# Patient Record
Sex: Male | Born: 1955 | Race: Black or African American | Hispanic: No | Marital: Single | Smoking: Current every day smoker
Health system: Southern US, Community
[De-identification: ages and names within clinical notes are randomized; demographics above are authoritative.]

## PROBLEM LIST (undated history)

## (undated) DIAGNOSIS — I639 Cerebral infarction, unspecified: Secondary | ICD-10-CM

## (undated) DIAGNOSIS — I10 Essential (primary) hypertension: Secondary | ICD-10-CM

## (undated) HISTORY — PX: NO PAST SURGERIES: SHX2092

---

## 1998-09-07 ENCOUNTER — Emergency Department (HOSPITAL_COMMUNITY): Admission: EM | Admit: 1998-09-07 | Discharge: 1998-09-07 | Payer: Self-pay | Admitting: Emergency Medicine

## 2005-10-12 ENCOUNTER — Emergency Department (HOSPITAL_COMMUNITY): Admission: EM | Admit: 2005-10-12 | Discharge: 2005-10-12 | Payer: Self-pay | Admitting: Emergency Medicine

## 2005-10-28 ENCOUNTER — Ambulatory Visit: Payer: Self-pay | Admitting: *Deleted

## 2005-10-28 ENCOUNTER — Ambulatory Visit: Payer: Self-pay | Admitting: Family Medicine

## 2005-11-18 ENCOUNTER — Ambulatory Visit: Payer: Self-pay | Admitting: Family Medicine

## 2005-12-16 ENCOUNTER — Ambulatory Visit: Payer: Self-pay | Admitting: Family Medicine

## 2006-02-17 ENCOUNTER — Ambulatory Visit: Payer: Self-pay | Admitting: Family Medicine

## 2006-07-03 ENCOUNTER — Ambulatory Visit: Payer: Self-pay | Admitting: Family Medicine

## 2006-08-12 ENCOUNTER — Ambulatory Visit: Payer: Self-pay | Admitting: Family Medicine

## 2006-09-04 ENCOUNTER — Ambulatory Visit: Payer: Self-pay | Admitting: Family Medicine

## 2006-09-15 ENCOUNTER — Ambulatory Visit: Payer: Self-pay | Admitting: Family Medicine

## 2006-10-13 ENCOUNTER — Ambulatory Visit: Payer: Self-pay | Admitting: Family Medicine

## 2006-11-17 ENCOUNTER — Ambulatory Visit: Payer: Self-pay | Admitting: Family Medicine

## 2006-11-18 ENCOUNTER — Encounter (INDEPENDENT_AMBULATORY_CARE_PROVIDER_SITE_OTHER): Payer: Self-pay | Admitting: Family Medicine

## 2006-12-30 ENCOUNTER — Encounter (INDEPENDENT_AMBULATORY_CARE_PROVIDER_SITE_OTHER): Payer: Self-pay | Admitting: *Deleted

## 2008-11-26 ENCOUNTER — Emergency Department (HOSPITAL_COMMUNITY): Admission: EM | Admit: 2008-11-26 | Discharge: 2008-11-26 | Payer: Self-pay | Admitting: Emergency Medicine

## 2010-06-12 ENCOUNTER — Emergency Department (HOSPITAL_COMMUNITY)
Admission: EM | Admit: 2010-06-12 | Discharge: 2010-06-12 | Disposition: A | Discharge status: Home / Self Care | Payer: Self-pay | Attending: Emergency Medicine | Admitting: Emergency Medicine

## 2010-06-12 DIAGNOSIS — F17200 Nicotine dependence, unspecified, uncomplicated: Secondary | ICD-10-CM | POA: Insufficient documentation

## 2010-06-12 DIAGNOSIS — K029 Dental caries, unspecified: Secondary | ICD-10-CM | POA: Insufficient documentation

## 2010-06-12 DIAGNOSIS — K089 Disorder of teeth and supporting structures, unspecified: Secondary | ICD-10-CM | POA: Insufficient documentation

## 2010-07-11 ENCOUNTER — Emergency Department (HOSPITAL_COMMUNITY)
Admission: EM | Admit: 2010-07-11 | Discharge: 2010-07-11 | Disposition: A | Discharge status: Home / Self Care | Payer: Self-pay | Attending: Emergency Medicine | Admitting: Emergency Medicine

## 2010-07-11 DIAGNOSIS — F17200 Nicotine dependence, unspecified, uncomplicated: Secondary | ICD-10-CM | POA: Insufficient documentation

## 2010-07-11 DIAGNOSIS — R599 Enlarged lymph nodes, unspecified: Secondary | ICD-10-CM | POA: Insufficient documentation

## 2010-07-11 DIAGNOSIS — K089 Disorder of teeth and supporting structures, unspecified: Secondary | ICD-10-CM | POA: Insufficient documentation

## 2012-06-02 ENCOUNTER — Encounter (HOSPITAL_COMMUNITY): Payer: Self-pay | Admitting: Emergency Medicine

## 2012-06-02 ENCOUNTER — Emergency Department (HOSPITAL_COMMUNITY)
Admission: EM | Admit: 2012-06-02 | Discharge: 2012-06-04 | Disposition: A | Discharge status: Home / Self Care | Payer: Medicaid Other | Attending: Emergency Medicine | Admitting: Emergency Medicine

## 2012-06-02 DIAGNOSIS — F17200 Nicotine dependence, unspecified, uncomplicated: Secondary | ICD-10-CM | POA: Insufficient documentation

## 2012-06-02 DIAGNOSIS — F1020 Alcohol dependence, uncomplicated: Secondary | ICD-10-CM | POA: Insufficient documentation

## 2012-06-02 LAB — CBC WITH DIFFERENTIAL/PLATELET
Basophils Absolute: 0 10*3/uL (ref 0.0–0.1)
Basophils Relative: 0 % (ref 0–1)
Eosinophils Relative: 1 % (ref 0–5)
HCT: 33.5 % — ABNORMAL LOW (ref 39.0–52.0)
MCHC: 36.1 g/dL — ABNORMAL HIGH (ref 30.0–36.0)
MCV: 93.6 fL (ref 78.0–100.0)
Monocytes Absolute: 0.5 10*3/uL (ref 0.1–1.0)
Neutro Abs: 4.5 10*3/uL (ref 1.7–7.7)
RDW: 12.9 % (ref 11.5–15.5)

## 2012-06-02 LAB — RAPID URINE DRUG SCREEN, HOSP PERFORMED
Barbiturates: NOT DETECTED
Benzodiazepines: NOT DETECTED
Cocaine: NOT DETECTED
Tetrahydrocannabinol: NOT DETECTED

## 2012-06-02 LAB — COMPREHENSIVE METABOLIC PANEL
AST: 63 U/L — ABNORMAL HIGH (ref 0–37)
Albumin: 3.7 g/dL (ref 3.5–5.2)
Calcium: 8.7 mg/dL (ref 8.4–10.5)
Creatinine, Ser: 0.78 mg/dL (ref 0.50–1.35)

## 2012-06-02 MED ORDER — FOLIC ACID 1 MG PO TABS
1.0000 mg | ORAL_TABLET | Freq: Every day | ORAL | Status: DC
  Administered 2012-06-03 – 2012-06-04 (×2): 1 mg via ORAL
  Filled 2012-06-02 (×2): qty 1

## 2012-06-02 MED ORDER — LORAZEPAM 1 MG PO TABS
1.0000 mg | ORAL_TABLET | Freq: Three times a day (TID) | ORAL | Status: DC | PRN
  Administered 2012-06-03: 1 mg via ORAL
  Filled 2012-06-02: qty 1

## 2012-06-02 MED ORDER — LORAZEPAM 2 MG/ML IJ SOLN: 1.0000 mg | Freq: Four times a day (QID) | INTRAMUSCULAR | Status: DC | PRN

## 2012-06-02 MED ORDER — ADULT MULTIVITAMIN W/MINERALS CH
1.0000 | ORAL_TABLET | Freq: Every day | ORAL | Status: DC
  Administered 2012-06-03 – 2012-06-04 (×2): 1 via ORAL
  Filled 2012-06-02 (×2): qty 1

## 2012-06-02 MED ORDER — LORAZEPAM 1 MG PO TABS: 1.0000 mg | ORAL_TABLET | Freq: Four times a day (QID) | ORAL | Status: DC | PRN

## 2012-06-02 MED ORDER — NICOTINE 21 MG/24HR TD PT24
21.0000 mg | MEDICATED_PATCH | Freq: Every day | TRANSDERMAL | Status: DC
  Administered 2012-06-03 – 2012-06-04 (×2): 21 mg via TRANSDERMAL
  Filled 2012-06-02 (×2): qty 1

## 2012-06-02 MED ORDER — ALUM & MAG HYDROXIDE-SIMETH 200-200-20 MG/5ML PO SUSP: 30.0000 mL | ORAL | Status: DC | PRN

## 2012-06-02 MED ORDER — ONDANSETRON HCL 4 MG PO TABS: 4.0000 mg | ORAL_TABLET | Freq: Three times a day (TID) | ORAL | Status: DC | PRN

## 2012-06-02 MED ORDER — ZOLPIDEM TARTRATE 5 MG PO TABS: 5.0000 mg | ORAL_TABLET | Freq: Every evening | ORAL | Status: DC | PRN

## 2012-06-02 MED ORDER — IBUPROFEN 200 MG PO TABS: 600.0000 mg | ORAL_TABLET | Freq: Three times a day (TID) | ORAL | Status: DC | PRN

## 2012-06-02 MED ORDER — THIAMINE HCL 100 MG/ML IJ SOLN: 100.0000 mg | Freq: Every day | INTRAMUSCULAR | Status: DC

## 2012-06-02 MED ORDER — VITAMIN B-1 100 MG PO TABS
100.0000 mg | ORAL_TABLET | Freq: Every day | ORAL | Status: DC
  Administered 2012-06-03 – 2012-06-04 (×2): 100 mg via ORAL
  Filled 2012-06-02 (×2): qty 1

## 2012-06-02 NOTE — ED Notes (Signed)
Pt states he is here for detox from alcohol  Last drank today  Pt usually drinks a 12pk of beer per day  Pt has been drinking for 30+ years

## 2012-06-02 NOTE — ED Provider Notes (Signed)
History    This chart was scribed for Johnnette Gourd, PA-C,non-physician practitioner working with Laray Anger, DO by Charolett Bumpers, ED Scribe. This patient was seen in room [facility]3/[facility]3 and the patient's care was started at 2147.    CSN: [id number]  Arrival date & time 06/02/12  2038   First MD Initiated Contact with Patient 06/02/12 2147      Chief Complaint  Patient presents with  . detox    Level V Caveat: Intoxicated  The history is provided by the patient. The history is limited by the condition of the patient. No language interpreter was used.   Jose Neal is a 57 y.o. male who presents to the Emergency Department for alcohol detox. He states that he drank 3-4 40 oz beers today. He has been drinking for the past 30 years and drinks a 12 pk of beer daily. He denies any other drug use. He states that he has never been in a detox program in the past. He states that he wants help with detox and was brought here by his daughters. He states that he needs help detoxing and cannot do this outside of a hospital since he "will just continue".  History reviewed. No pertinent past medical history.  History reviewed. No pertinent past surgical history.  Family History  Problem Relation Age of Onset  . Cancer Mother   . Diabetes Father   . Glaucoma Father   . Cancer Sister   . Glaucoma Other     History  Substance Use Topics  . Smoking status: Current Every Day Smoker    Types: Cigarettes  . Smokeless tobacco: Not on file  . Alcohol Use: 7.2 oz/week    12 Cans of beer per week     Comment: heavy      Review of Systems  Unable to perform ROS: Other  Intoxicated  Allergies  Review of patient's allergies indicates no known allergies.  Home Medications  No current outpatient prescriptions on file.  BP 149/84  Pulse 71  Temp(Src) 97.8 F (36.6 C) (Oral)  Resp 20  Wt 182 lb 6 oz (82.725 kg)  SpO2 100%  Physical Exam  Nursing note and vitals  reviewed. Constitutional: He is oriented to person, place, and time. He appears well-developed and well-nourished. No distress.  Breath smells of ETOH.   HENT:  Head: Normocephalic and atraumatic.  Right Ear: External ear normal.  Left Ear: External ear normal.  Nose: Nose normal.  Mouth/Throat: Oropharynx is clear and moist. No oropharyngeal exudate.  Eyes: Conjunctivae and EOM are normal. Pupils are equal, round, and reactive to light.  Neck: Normal range of motion. Neck supple. No tracheal deviation present.  Cardiovascular: Normal rate, regular rhythm and normal heart sounds.   Pulmonary/Chest: Effort normal and breath sounds normal. No respiratory distress.  Abdominal: Soft. Bowel sounds are normal. He exhibits no distension.  Musculoskeletal: Normal range of motion. He exhibits no edema.  Neurological: He is alert and oriented to person, place, and time.  Skin: Skin is warm and dry.  Psychiatric: His speech is tangential and slurred. He is hyperactive. He expresses no homicidal and no suicidal ideation.    ED Course  Procedures (including critical care time)  DIAGNOSTIC STUDIES: Oxygen Saturation is 100% on room air, normal by my interpretation.    COORDINATION OF CARE:  22:00-Discussed planned course of treatment with the patient including blood work and urine screen who is agreeable at this time.  Labs Reviewed  CBC WITH DIFFERENTIAL  COMPREHENSIVE METABOLIC PANEL  URINE RAPID DRUG SCREEN (HOSP PERFORMED)  ETHANOL   No results found.   No diagnosis found.    MDM  57 y/o male requesting detox from alcohol. He has not been sober for over 30 years. Patient placed on CIWA protocol. Awaiting ACT team consult for placement.    I personally performed the services described in this documentation, which was scribed in my presence. The recorded information has been reviewed and is accurate.      Trevor Mace, PA-C 06/03/12 0010

## 2012-06-03 DIAGNOSIS — F1020 Alcohol dependence, uncomplicated: Secondary | ICD-10-CM

## 2012-06-03 LAB — COMPREHENSIVE METABOLIC PANEL
AST: 71 U/L — ABNORMAL HIGH (ref 0–37)
Albumin: 3.5 g/dL (ref 3.5–5.2)
Calcium: 9.1 mg/dL (ref 8.4–10.5)
Creatinine, Ser: 0.74 mg/dL (ref 0.50–1.35)
Total Protein: 7 g/dL (ref 6.0–8.3)

## 2012-06-03 LAB — CBC WITH DIFFERENTIAL/PLATELET
Basophils Absolute: 0 10*3/uL (ref 0.0–0.1)
Basophils Relative: 0 % (ref 0–1)
Eosinophils Relative: 1 % (ref 0–5)
HCT: 32.9 % — ABNORMAL LOW (ref 39.0–52.0)
MCHC: 35.9 g/dL (ref 30.0–36.0)
MCV: 93.7 fL (ref 78.0–100.0)
Monocytes Absolute: 0.5 10*3/uL (ref 0.1–1.0)
RDW: 12.9 % (ref 11.5–15.5)

## 2012-06-03 LAB — ETHANOL: Alcohol, Ethyl (B): 156 mg/dL — ABNORMAL HIGH (ref 0–11)

## 2012-06-03 MED ORDER — SODIUM CHLORIDE 0.9 % IV BOLUS (SEPSIS)
1000.0000 mL | Freq: Once | INTRAVENOUS | Status: AC
  Administered 2012-06-03: 1000 mL via INTRAVENOUS

## 2012-06-03 NOTE — BH Assessment (Signed)
Assessment Note   Jose Neal is a 57 y.o. male presenting to [facility] for detox from alcohol.  Pt denies SI/HI/Psych.  Pt consumes 12 pk of beer and 1-2 shots daily, last intake was 06/02/12.  Pt has no prior detox hx nd denies any current w/d sxs.  Pt is homeless and says his children encouraged him to seek treatment because they are concerned about his chronic substance abuse.  Pt denies any legal issues. Pt has no issues with seizures or blackouts.    Axis I: Alcohol Dependence  Axis II: Deferred Axis III: History reviewed. No pertinent past medical history. Axis IV: housing problems, other psychosocial or environmental problems, problems related to social environment and problems with primary support group Axis V: 51-60 moderate symptoms  Past Medical History: History reviewed. No pertinent past medical history.  History reviewed. No pertinent past surgical history.  Family History:  Family History  Problem Relation Age of Onset  . Cancer Mother   . Diabetes Father   . Glaucoma Father   . Cancer Sister   . Glaucoma Other     Social History:  reports that he has been smoking Cigarettes.  He has been smoking about 0.00 packs per day. He does not have any smokeless tobacco history on file. He reports that he drinks about 7.2 ounces of alcohol per week. He reports that he does not use illicit drugs.  Additional Social History:  Alcohol / Drug Use Pain Medications: None  Prescriptions: None  Over the Counter: None  History of alcohol / drug use?: Yes Longest period of sobriety (when/how long): None  Negative Consequences of Use: Personal relationships Withdrawal Symptoms: Other (Comment) (No current w/d sxs) Substance #1 Name of Substance 1: Alcohol  1 - Age of First Use: 20 YOM  1 - Amount (size/oz): 12 Pk; 1-2 Shots  1 - Frequency: Daily  1 - Duration: On-going  1 - Last Use / Amount: 06/02/12  CIWA: CIWA-Ar BP: 149/84 mmHg Pulse Rate: 71 Nausea and Vomiting: no  nausea and no vomiting Tactile Disturbances: none Tremor: no tremor Auditory Disturbances: not present Paroxysmal Sweats: no sweat visible Visual Disturbances: not present Anxiety: no anxiety, at ease Headache, Fullness in Head: none present Agitation: normal activity Orientation and Clouding of Sensorium: oriented and can do serial additions CIWA-Ar Total: 0 COWS:    Allergies: No Known Allergies  Home Medications:  (Not in a hospital admission)  OB/GYN Status:  No LMP for male patient.  General Assessment Data Location of Assessment: [facility] Living Arrangements: Other (Comment) (Homeless ) Can pt return to current living arrangement?: No Admission Status: Voluntary Is patient capable of signing voluntary admission?: Yes Transfer from: Acute Hospital Referral Source: MD  Education Status Is patient currently in school?: No Current Grade: None  Highest grade of school patient has completed: None  Name of school: None  Contact person: None   Risk to self Suicidal Ideation: No Suicidal Intent: No Is patient at risk for suicide?: No Suicidal Plan?: No Access to Means: No What has been your use of drugs/alcohol within the last 12 months?: Abusing: alcohol  Previous Attempts/Gestures: No How many times?: 0 Other Self Harm Risks: None  Triggers for Past Attempts: None known Intentional Self Injurious Behavior: None Family Suicide History: No Recent stressful life event(s): Other (Comment) (Homeless; Chronic SA) Persecutory voices/beliefs?: No Depression: No Depression Symptoms:  (None Reported ) Substance abuse history and/or treatment for substance abuse?: Yes Suicide prevention information given to non-admitted  patients: Not applicable  Risk to Others Homicidal Ideation: No Thoughts of Harm to Others: No Current Homicidal Intent: No Current Homicidal Plan: No Access to Homicidal Means: No Identified Victim: None  History of harm to others?: No Assessment of  Violence: None Noted Violent Behavior Description: None  Does patient have access to weapons?: No Criminal Charges Pending?: No Does patient have a court date: No  Psychosis Hallucinations: None noted Delusions: None noted  Mental Status Report Appear/Hygiene: Disheveled Eye Contact: Fair Motor Activity: Unremarkable Speech: Logical/coherent Level of Consciousness: Alert Mood: Other (Comment) (Appropriate, pleasant ) Affect: Appropriate to circumstance Anxiety Level: None Thought Processes: Coherent;Relevant Judgement: Unimpaired Orientation: Person;Place;Time;Situation Obsessive Compulsive Thoughts/Behaviors: None  Cognitive Functioning Concentration: Normal Memory: Recent Intact;Remote Intact IQ: Average Insight: Fair Impulse Control: Fair Appetite: Good Weight Loss: 0 Weight Gain: 0 Sleep: No Change Total Hours of Sleep: 8 Vegetative Symptoms: None  ADLScreening [facility]) Patient's cognitive ability adequate to safely complete daily activities?: Yes Patient able to express need for assistance with ADLs?: Yes Independently performs ADLs?: Yes (appropriate for developmental age)  Abuse/Neglect [facility]) Physical Abuse: Denies Verbal Abuse: Denies Sexual Abuse: Denies  Prior Inpatient Therapy Prior Inpatient Therapy: No Prior Therapy Dates: None  Prior Therapy Facilty/Provider(s): None  Reason for Treatment: None   Prior Outpatient Therapy Prior Outpatient Therapy: No Prior Therapy Dates: None  Prior Therapy Facilty/Provider(s): None  Reason for Treatment: None   ADL Screening (condition at time of admission) Patient's cognitive ability adequate to safely complete daily activities?: Yes Patient able to express need for assistance with ADLs?: Yes Independently performs ADLs?: Yes (appropriate for developmental age) Weakness of Legs: None Weakness of Arms/Hands: None  Home Assistive Devices/Equipment Home Assistive Devices/Equipment:  None  Therapy Consults (therapy consults require a physician order) PT Evaluation Needed: No OT Evalulation Needed: No SLP Evaluation Needed: No Abuse/Neglect Assessment (Assessment to be complete while patient is alone) Physical Abuse: Denies Verbal Abuse: Denies Sexual Abuse: Denies Exploitation of patient/patient's resources: Denies Self-Neglect: Denies Values / Beliefs Cultural Requests During Hospitalization: None Spiritual Requests During Hospitalization: None   Advance Directives (For Healthcare) Advance Directive: Patient does not have advance directive;Patient would not like information Pre-existing out of facility DNR order (yellow form or pink MOST form): No Nutrition Screen- [facility] Patient's home diet: Regular Have you recently lost weight without trying?: No Have you been eating poorly because of a decreased appetite?: No Malnutrition Screening Tool Score: 0  Additional Information 1:1 In Past 12 Months?: No CIRT Risk: No Elopement Risk: No Does patient have medical clearance?: Yes     Disposition:  Disposition Disposition of Patient: Inpatient treatment program;Referred to [facility] ) Type of inpatient treatment program: Adult Patient referred to: Other (Comment) [facility] )  On Site Evaluation by:   Reviewed with Physician:     Murrell Redden 06/03/2012 12:26 AM

## 2012-06-03 NOTE — ED Provider Notes (Addendum)
Patient sleeping on AM rounds.  Filed Vitals:   06/03/12 0547  BP: 145/71  Pulse: 74  Temp:   Resp:     Placement pending    Gerhard Munch, MD 06/03/12 [phone]  I was made aware that the patient's Na was 123.  The patient had been in Psych ED, but will be transferred to [facility] for IVF, repeat labs.  3:18 PM  Repeat sodium is closer to normal.  The patient will continue to receive rehydration, and he remains in no distress with unremarkable vital signs, pending behavioral health placement.  Gerhard Munch, MD 06/03/12 [phone]

## 2012-06-03 NOTE — ED Notes (Signed)
NS bolus complete.

## 2012-06-03 NOTE — BHH Counselor (Signed)
Patient reviewed for admission to [facility] by Donell Sievert, PA and declined due to hyponatremia, with a sodium of 123, and stated that the patient needs IV fluids.

## 2012-06-03 NOTE — ED Notes (Signed)
Lab at bedside to collect blood.  

## 2012-06-03 NOTE — Consult Note (Signed)
Reason for Consult: Alcohol intoxication and seeking for the detox treatment Referring Physician: Dr. Annita Brod is an 57 y.o. male.  HPI: Patient was seen and chart reviewed. Patient came to the [facility] long emergency department [place] requesting alcohol detox treatment. Patient has been drinking several years and has no previous alcohol detox treatment at rehabilitation services. Patient has an electrolyte imbalance receiving the supplementation to IV line. Patient has a mild tremor on the upper extremity. Patient has no history of alcohol seizures blackouts or motor vehicle accidents. Patient does note symptoms of depression psychosis. And  MSE: Patient was awake alert oriented place person and situation. Patient has anxious mood with the appropriate affect he has normal rate rhythm and volume of speech his thought process is linear and goal-directed he has denied suicidal onset ideation and has no evidence of psychotic symptoms.  History reviewed. No pertinent past medical history.  History reviewed. No pertinent past surgical history.  Family History  Problem Relation Age of Onset  . Cancer Mother   . Diabetes Father   . Glaucoma Father   . Cancer Sister   . Glaucoma Other     Social History:  reports that he has been smoking Cigarettes.  He has been smoking about 0.00 packs per day. He does not have any smokeless tobacco history on file. He reports that he drinks about 7.2 ounces of alcohol per week. He reports that he does not use illicit drugs.  Allergies: No Known Allergies  Medications: I have reviewed the patient's current medications.  Results for orders placed during the hospital encounter of 06/02/12 (from the past 48 hour(s))  URINE RAPID DRUG SCREEN (HOSP PERFORMED)     Status: None   Collection Time    06/02/12 10:14 PM      Result Value Range   Opiates NONE DETECTED  NONE DETECTED   Cocaine NONE DETECTED  NONE DETECTED   Benzodiazepines NONE  DETECTED  NONE DETECTED   Amphetamines NONE DETECTED  NONE DETECTED   Tetrahydrocannabinol NONE DETECTED  NONE DETECTED   Barbiturates NONE DETECTED  NONE DETECTED   Comment:            DRUG SCREEN FOR MEDICAL PURPOSES     ONLY.  IF CONFIRMATION IS NEEDED     FOR ANY PURPOSE, NOTIFY LAB     WITHIN 5 DAYS.                LOWEST DETECTABLE LIMITS     FOR URINE DRUG SCREEN     Drug Class       Cutoff (ng/mL)     Amphetamine      1000     Barbiturate      200     Benzodiazepine   200     Tricyclics       300     Opiates          300     Cocaine          300     THC              50  CBC WITH DIFFERENTIAL     Status: Abnormal   Collection Time    06/02/12 10:15 PM      Result Value Range   WBC 7.9  4.0 - 10.5 K/uL   RBC 3.58 (*) 4.22 - 5.81 MIL/uL   Hemoglobin 12.1 (*) 13.0 - 17.0 g/dL   HCT 29.5 (*) 62.1 -  52.0 %   MCV 93.6  78.0 - 100.0 fL   MCH 33.8  26.0 - 34.0 pg   MCHC 36.1 (*) 30.0 - 36.0 g/dL   RDW 27.2  53.6 - 64.4 %   Platelets 144 (*) 150 - 400 K/uL   Neutrophils Relative 58  43 - 77 %   Neutro Abs 4.5  1.7 - 7.7 K/uL   Lymphocytes Relative 35  12 - 46 %   Lymphs Abs 2.8  0.7 - 4.0 K/uL   Monocytes Relative 7  3 - 12 %   Monocytes Absolute 0.5  0.1 - 1.0 K/uL   Eosinophils Relative 1  0 - 5 %   Eosinophils Absolute 0.1  0.0 - 0.7 K/uL   Basophils Relative 0  0 - 1 %   Basophils Absolute 0.0  0.0 - 0.1 K/uL  COMPREHENSIVE METABOLIC PANEL     Status: Abnormal   Collection Time    06/02/12 10:15 PM      Result Value Range   Sodium 123 (*) 135 - 145 mEq/L   Potassium 3.7  3.5 - 5.1 mEq/L   Chloride 86 (*) 96 - 112 mEq/L   CO2 23  19 - 32 mEq/L   Glucose, Bld 95  70 - 99 mg/dL   BUN 5 (*) 6 - 23 mg/dL   Creatinine, Ser 0.34  0.50 - 1.35 mg/dL   Calcium 8.7  8.4 - 74.2 mg/dL   Total Protein 7.9  6.0 - 8.3 g/dL   Albumin 3.7  3.5 - 5.2 g/dL   AST 63 (*) 0 - 37 U/L   ALT 58 (*) 0 - 53 U/L   Alkaline Phosphatase 103  39 - 117 U/L   Total Bilirubin 0.3  0.3 -  1.2 mg/dL   GFR calc non Af Amer >90  >90 mL/min   GFR calc Af Amer >90  >90 mL/min   Comment:            The eGFR has been calculated     using the CKD EPI equation.     This calculation has not been     validated in all clinical     situations.     eGFR's persistently     <90 mL/min signify     possible Chronic Kidney Disease.  ETHANOL     Status: Abnormal   Collection Time    06/02/12 10:15 PM      Result Value Range   Alcohol, Ethyl (B) 247 (*) 0 - 11 mg/dL   Comment:            LOWEST DETECTABLE LIMIT FOR     SERUM ALCOHOL IS 11 mg/dL     FOR MEDICAL PURPOSES ONLY  ETHANOL     Status: Abnormal   Collection Time    06/03/12  2:20 AM      Result Value Range   Alcohol, Ethyl (B) 156 (*) 0 - 11 mg/dL   Comment:            LOWEST DETECTABLE LIMIT FOR     SERUM ALCOHOL IS 11 mg/dL     FOR MEDICAL PURPOSES ONLY  CBC WITH DIFFERENTIAL     Status: Abnormal   Collection Time    06/03/12 12:47 PM      Result Value Range   WBC 6.4  4.0 - 10.5 K/uL   RBC 3.51 (*) 4.22 - 5.81 MIL/uL   Hemoglobin 11.8 (*) 13.0 - 17.0 g/dL  HCT 32.9 (*) 39.0 - 52.0 %   MCV 93.7  78.0 - 100.0 fL   MCH 33.6  26.0 - 34.0 pg   MCHC 35.9  30.0 - 36.0 g/dL   RDW 45.4  09.8 - 11.9 %   Platelets 167  150 - 400 K/uL   Neutrophils Relative 63  43 - 77 %   Neutro Abs 4.0  1.7 - 7.7 K/uL   Lymphocytes Relative 27  12 - 46 %   Lymphs Abs 1.7  0.7 - 4.0 K/uL   Monocytes Relative 8  3 - 12 %   Monocytes Absolute 0.5  0.1 - 1.0 K/uL   Eosinophils Relative 1  0 - 5 %   Eosinophils Absolute 0.1  0.0 - 0.7 K/uL   Basophils Relative 0  0 - 1 %   Basophils Absolute 0.0  0.0 - 0.1 K/uL  COMPREHENSIVE METABOLIC PANEL     Status: Abnormal   Collection Time    06/03/12 12:47 PM      Result Value Range   Sodium 129 (*) 135 - 145 mEq/L   Potassium 4.6  3.5 - 5.1 mEq/L   Comment: DELTA CHECK NOTED     REPEATED TO VERIFY     NO VISIBLE HEMOLYSIS   Chloride 95 (*) 96 - 112 mEq/L   Comment: DELTA CHECK NOTED      REPEATED TO VERIFY   CO2 24  19 - 32 mEq/L   Glucose, Bld 92  70 - 99 mg/dL   BUN 9  6 - 23 mg/dL   Creatinine, Ser 1.47  0.50 - 1.35 mg/dL   Calcium 9.1  8.4 - 82.9 mg/dL   Total Protein 7.0  6.0 - 8.3 g/dL   Albumin 3.5  3.5 - 5.2 g/dL   AST 71 (*) 0 - 37 U/L   ALT 56 (*) 0 - 53 U/L   Alkaline Phosphatase 102  39 - 117 U/L   Total Bilirubin 0.3  0.3 - 1.2 mg/dL   GFR calc non Af Amer >90  >90 mL/min   GFR calc Af Amer >90  >90 mL/min   Comment:            The eGFR has been calculated     using the CKD EPI equation.     This calculation has not been     validated in all clinical     situations.     eGFR's persistently     <90 mL/min signify     possible Chronic Kidney Disease.  ETHANOL     Status: None   Collection Time    06/03/12 12:47 PM      Result Value Range   Alcohol, Ethyl (B) <11  0 - 11 mg/dL   Comment:            LOWEST DETECTABLE LIMIT FOR     SERUM ALCOHOL IS 11 mg/dL     FOR MEDICAL PURPOSES ONLY    No results found.  Positive for excessive alcohol consumption Blood pressure 156/88, pulse 70, temperature 99.5 F (37.5 C), temperature source Oral, resp. rate 20, weight 182 lb 6 oz (82.725 kg), SpO2 99.00%.   Assessment/Plan: Alcohol dependence  Alcohol intoxication and withdrawl  Recommendation: Recommended inpatient psychiatric hospitalization for alcohol detox treatment and then rehabilitation services. Patient needs to be medically stable for inpatient hospitalization at behavior health.   Norvil Martensen,JANARDHAHA R. 06/03/2012, 4:14 PM

## 2012-06-03 NOTE — ED Notes (Signed)
Pt offered Ativan for restlessness. Pt refused. Will continue to monitor.

## 2012-06-03 NOTE — Progress Notes (Signed)
[facility] ED CM consulted with ED RN, Danne Harbor about treatment for low NA Encouraged her to speak with EDP about further labs for 06/03/12 pm or 06/04/12 to re check NA

## 2012-06-03 NOTE — ED Notes (Signed)
[facility] aware of pts repeat sodium of 129. Reports that pt will not be considered medically cleared until sodium is closer to 135. Pt has had 2 liters of normal saline. Repeat cmet has been ordered for 0630 on 06/04/12. Saline lock remains intact. Pt aware. No disposition to be set until improvement in sodium level.

## 2012-06-03 NOTE — ED Provider Notes (Signed)
Medical screening examination/treatment/procedure(s) were performed by non-physician practitioner and as supervising physician I was immediately available for consultation/collaboration.   Alvah Gilder M Eyanna Mcgonagle, DO 06/03/12 1229 

## 2012-06-04 LAB — COMPREHENSIVE METABOLIC PANEL
ALT: 49 U/L (ref 0–53)
AST: 52 U/L — ABNORMAL HIGH (ref 0–37)
Albumin: 3.6 g/dL (ref 3.5–5.2)
Alkaline Phosphatase: 100 U/L (ref 39–117)
BUN: 9 mg/dL (ref 6–23)
CO2: 24 mEq/L (ref 19–32)
Calcium: 9.3 mg/dL (ref 8.4–10.5)
Chloride: 94 mEq/L — ABNORMAL LOW (ref 96–112)
Creatinine, Ser: 0.86 mg/dL (ref 0.50–1.35)
GFR calc Af Amer: >90 mL/min (ref >90)
GFR calc non Af Amer: >90 mL/min (ref >90)
Glucose, Bld: 111 mg/dL — ABNORMAL HIGH (ref 70–99)
Potassium: 4.1 mEq/L (ref 3.5–5.1)
Sodium: 128 mEq/L — ABNORMAL LOW (ref 135–145)
Total Bilirubin: 0.4 mg/dL (ref 0.3–1.2)
Total Protein: 7.3 g/dL (ref 6.0–8.3)

## 2012-06-04 NOTE — BHH Counselor (Signed)
Patient no longer meets criteria for inpatient psychiatric detox. Patient has been given residential treatment facilities list to follow up with outpatient services.

## 2012-06-04 NOTE — ED Provider Notes (Signed)
Filed Vitals:   06/04/12 0620  BP: 144/90  Pulse: 67  Temp:   Resp:    Pt with no acute events overnight. Apparently needs near normal Na level to be medically cleared although baseline unknown and asymptomatic. To be repeated this am. Pt still reports he wants detox although he doesn't seem to have clear plan after that. Pending further review at [facility].   Raeford Razor, MD 06/04/12 [phone]

## 2012-06-04 NOTE — Consult Note (Signed)
Reason for Consult: Alcohol dependence Referring Physician: Dr. Aldean Jewett is an 57 y.o. male.  HPI: Patient has been staying in his bed, quite cooperative to. Patient has no reported alcohol with the drive symptoms. Staff nurse reported the patient has no specific symptoms of withdrawal as per CIWA and does not requite when necessary medication. Patient denies symptoms of depression anxiety and psychosis  MSE: Patient was calm and quite cooperative. Patient has no abnormal psychomotor activity. Patient has fine mood with the appropriate affect. Patient has normal speech and thought process. Patient has no evidence of psychotic symptoms.  History reviewed. No pertinent past medical history.  History reviewed. No pertinent past surgical history.  Family History  Problem Relation Age of Onset  . Cancer Mother   . Diabetes Father   . Glaucoma Father   . Cancer Sister   . Glaucoma Other     Social History:  reports that he has been smoking Cigarettes.  He has been smoking about 0.00 packs per day. He does not have any smokeless tobacco history on file. He reports that he drinks about 7.2 ounces of alcohol per week. He reports that he does not use illicit drugs.  Allergies: No Known Allergies  Medications: I have reviewed the patient's current medications.  Results for orders placed during the hospital encounter of 06/02/12 (from the past 48 hour(s))  URINE RAPID DRUG SCREEN (HOSP PERFORMED)     Status: None   Collection Time    06/02/12 10:14 PM      Result Value Range   Opiates NONE DETECTED  NONE DETECTED   Cocaine NONE DETECTED  NONE DETECTED   Benzodiazepines NONE DETECTED  NONE DETECTED   Amphetamines NONE DETECTED  NONE DETECTED   Tetrahydrocannabinol NONE DETECTED  NONE DETECTED   Barbiturates NONE DETECTED  NONE DETECTED   Comment:            DRUG SCREEN FOR MEDICAL PURPOSES     ONLY.  IF CONFIRMATION IS NEEDED     FOR ANY PURPOSE, NOTIFY LAB     WITHIN 5  DAYS.                LOWEST DETECTABLE LIMITS     FOR URINE DRUG SCREEN     Drug Class       Cutoff (ng/mL)     Amphetamine      1000     Barbiturate      200     Benzodiazepine   200     Tricyclics       300     Opiates          300     Cocaine          300     THC              50  CBC WITH DIFFERENTIAL     Status: Abnormal   Collection Time    06/02/12 10:15 PM      Result Value Range   WBC 7.9  4.0 - 10.5 K/uL   RBC 3.58 (*) 4.22 - 5.81 MIL/uL   Hemoglobin 12.1 (*) 13.0 - 17.0 g/dL   HCT 16.1 (*) 09.6 - 04.5 %   MCV 93.6  78.0 - 100.0 fL   MCH 33.8  26.0 - 34.0 pg   MCHC 36.1 (*) 30.0 - 36.0 g/dL   RDW 40.9  81.1 - 91.4 %   Platelets 144 (*) 150 -  400 K/uL   Neutrophils Relative 58  43 - 77 %   Neutro Abs 4.5  1.7 - 7.7 K/uL   Lymphocytes Relative 35  12 - 46 %   Lymphs Abs 2.8  0.7 - 4.0 K/uL   Monocytes Relative 7  3 - 12 %   Monocytes Absolute 0.5  0.1 - 1.0 K/uL   Eosinophils Relative 1  0 - 5 %   Eosinophils Absolute 0.1  0.0 - 0.7 K/uL   Basophils Relative 0  0 - 1 %   Basophils Absolute 0.0  0.0 - 0.1 K/uL  COMPREHENSIVE METABOLIC PANEL     Status: Abnormal   Collection Time    06/02/12 10:15 PM      Result Value Range   Sodium 123 (*) 135 - 145 mEq/L   Potassium 3.7  3.5 - 5.1 mEq/L   Chloride 86 (*) 96 - 112 mEq/L   CO2 23  19 - 32 mEq/L   Glucose, Bld 95  70 - 99 mg/dL   BUN 5 (*) 6 - 23 mg/dL   Creatinine, Ser 1.61  0.50 - 1.35 mg/dL   Calcium 8.7  8.4 - 09.6 mg/dL   Total Protein 7.9  6.0 - 8.3 g/dL   Albumin 3.7  3.5 - 5.2 g/dL   AST 63 (*) 0 - 37 U/L   ALT 58 (*) 0 - 53 U/L   Alkaline Phosphatase 103  39 - 117 U/L   Total Bilirubin 0.3  0.3 - 1.2 mg/dL   GFR calc non Af Amer >90  >90 mL/min   GFR calc Af Amer >90  >90 mL/min   Comment:            The eGFR has been calculated     using the CKD EPI equation.     This calculation has not been     validated in all clinical     situations.     eGFR's persistently     <90 mL/min signify      possible Chronic Kidney Disease.  ETHANOL     Status: Abnormal   Collection Time    06/02/12 10:15 PM      Result Value Range   Alcohol, Ethyl (B) 247 (*) 0 - 11 mg/dL   Comment:            LOWEST DETECTABLE LIMIT FOR     SERUM ALCOHOL IS 11 mg/dL     FOR MEDICAL PURPOSES ONLY  ETHANOL     Status: Abnormal   Collection Time    06/03/12  2:20 AM      Result Value Range   Alcohol, Ethyl (B) 156 (*) 0 - 11 mg/dL   Comment:            LOWEST DETECTABLE LIMIT FOR     SERUM ALCOHOL IS 11 mg/dL     FOR MEDICAL PURPOSES ONLY  CBC WITH DIFFERENTIAL     Status: Abnormal   Collection Time    06/03/12 12:47 PM      Result Value Range   WBC 6.4  4.0 - 10.5 K/uL   RBC 3.51 (*) 4.22 - 5.81 MIL/uL   Hemoglobin 11.8 (*) 13.0 - 17.0 g/dL   HCT 04.5 (*) 40.9 - 81.1 %   MCV 93.7  78.0 - 100.0 fL   MCH 33.6  26.0 - 34.0 pg   MCHC 35.9  30.0 - 36.0 g/dL   RDW 91.4  78.2 - 95.6 %  Platelets 167  150 - 400 K/uL   Neutrophils Relative 63  43 - 77 %   Neutro Abs 4.0  1.7 - 7.7 K/uL   Lymphocytes Relative 27  12 - 46 %   Lymphs Abs 1.7  0.7 - 4.0 K/uL   Monocytes Relative 8  3 - 12 %   Monocytes Absolute 0.5  0.1 - 1.0 K/uL   Eosinophils Relative 1  0 - 5 %   Eosinophils Absolute 0.1  0.0 - 0.7 K/uL   Basophils Relative 0  0 - 1 %   Basophils Absolute 0.0  0.0 - 0.1 K/uL  COMPREHENSIVE METABOLIC PANEL     Status: Abnormal   Collection Time    06/03/12 12:47 PM      Result Value Range   Sodium 129 (*) 135 - 145 mEq/L   Potassium 4.6  3.5 - 5.1 mEq/L   Comment: DELTA CHECK NOTED     REPEATED TO VERIFY     NO VISIBLE HEMOLYSIS   Chloride 95 (*) 96 - 112 mEq/L   Comment: DELTA CHECK NOTED     REPEATED TO VERIFY   CO2 24  19 - 32 mEq/L   Glucose, Bld 92  70 - 99 mg/dL   BUN 9  6 - 23 mg/dL   Creatinine, Ser 1.61  0.50 - 1.35 mg/dL   Calcium 9.1  8.4 - 09.6 mg/dL   Total Protein 7.0  6.0 - 8.3 g/dL   Albumin 3.5  3.5 - 5.2 g/dL   AST 71 (*) 0 - 37 U/L   ALT 56 (*) 0 - 53 U/L   Alkaline  Phosphatase 102  39 - 117 U/L   Total Bilirubin 0.3  0.3 - 1.2 mg/dL   GFR calc non Af Amer >90  >90 mL/min   GFR calc Af Amer >90  >90 mL/min   Comment:            The eGFR has been calculated     using the CKD EPI equation.     This calculation has not been     validated in all clinical     situations.     eGFR's persistently     <90 mL/min signify     possible Chronic Kidney Disease.  ETHANOL     Status: None   Collection Time    06/03/12 12:47 PM      Result Value Range   Alcohol, Ethyl (B) <11  0 - 11 mg/dL   Comment:            LOWEST DETECTABLE LIMIT FOR     SERUM ALCOHOL IS 11 mg/dL     FOR MEDICAL PURPOSES ONLY  COMPREHENSIVE METABOLIC PANEL     Status: Abnormal   Collection Time    06/04/12  9:30 AM      Result Value Range   Sodium 128 (*) 135 - 145 mEq/L   Potassium 4.1  3.5 - 5.1 mEq/L   Chloride 94 (*) 96 - 112 mEq/L   CO2 24  19 - 32 mEq/L   Glucose, Bld 111 (*) 70 - 99 mg/dL   BUN 9  6 - 23 mg/dL   Creatinine, Ser 0.45  0.50 - 1.35 mg/dL   Calcium 9.3  8.4 - 40.9 mg/dL   Total Protein 7.3  6.0 - 8.3 g/dL   Albumin 3.6  3.5 - 5.2 g/dL   AST 52 (*) 0 - 37 U/L   ALT 49  0 - 53 U/L   Alkaline Phosphatase 100  39 - 117 U/L   Total Bilirubin 0.4  0.3 - 1.2 mg/dL   GFR calc non Af Amer >90  >90 mL/min   GFR calc Af Amer >90  >90 mL/min   Comment:            The eGFR has been calculated     using the CKD EPI equation.     This calculation has not been     validated in all clinical     situations.     eGFR's persistently     <90 mL/min signify     possible Chronic Kidney Disease.    No results found.  Positive for excessive alcohol consumption Blood pressure 144/90, pulse 67, temperature 98.6 F (37 C), temperature source Oral, resp. rate 16, weight 182 lb 6 oz (82.725 kg), SpO2 100.00%.   Assessment/Plan: Alcohol intoxication  Alcohol dependence Hyponatremia  Patient does not meet criteria for a alcohol detox treatment at this time as he has no  symptoms of alcohol withdrawal after 72 hours being in the emergency department. Patient was receiving electrolyte supplementation and may able to go to the rehabilitation services upon the discharge patient was psychiatrically cleared for medical care and rehabilitation services.Darrol Jump R. 06/04/2012, 12:04 PM

## 2014-09-15 ENCOUNTER — Emergency Department (HOSPITAL_COMMUNITY)
Admission: EM | Admit: 2014-09-15 | Discharge: 2014-09-15 | Discharge status: Absent without leave | Payer: Self-pay | Source: Home / Self Care

## 2014-09-15 ENCOUNTER — Encounter (HOSPITAL_COMMUNITY): Payer: Self-pay | Admitting: *Deleted

## 2014-09-15 ENCOUNTER — Emergency Department (HOSPITAL_COMMUNITY)
Admission: EM | Admit: 2014-09-15 | Discharge: 2014-09-16 | Disposition: A | Discharge status: Home / Self Care | Payer: Medicaid Other | Attending: Emergency Medicine | Admitting: Emergency Medicine

## 2014-09-15 DIAGNOSIS — Z720 Tobacco use: Secondary | ICD-10-CM | POA: Diagnosis not present

## 2014-09-15 DIAGNOSIS — I10 Essential (primary) hypertension: Secondary | ICD-10-CM | POA: Diagnosis not present

## 2014-09-15 HISTORY — DX: Essential (primary) hypertension: I10

## 2014-09-15 LAB — COMPREHENSIVE METABOLIC PANEL
ALT: 19 U/L (ref 17–63)
ANION GAP: 10 (ref 5–15)
AST: 32 U/L (ref 15–41)
Albumin: 3.8 g/dL (ref 3.5–5.0)
Alkaline Phosphatase: 88 U/L (ref 38–126)
BUN: <5 mg/dL — ABNORMAL LOW (ref 6–20)
CHLORIDE: 96 mmol/L — AB (ref 101–111)
CO2: 23 mmol/L (ref 22–32)
CREATININE: 0.89 mg/dL (ref 0.61–1.24)
Calcium: 9 mg/dL (ref 8.9–10.3)
GFR calc Af Amer: >60 mL/min (ref >60)
GFR calc non Af Amer: >60 mL/min (ref >60)
GLUCOSE: 83 mg/dL (ref 65–99)
POTASSIUM: 4.3 mmol/L (ref 3.5–5.1)
Sodium: 129 mmol/L — ABNORMAL LOW (ref 135–145)
TOTAL PROTEIN: 7.7 g/dL (ref 6.5–8.1)
Total Bilirubin: 0.6 mg/dL (ref 0.3–1.2)

## 2014-09-15 LAB — CBC WITH DIFFERENTIAL/PLATELET
BASOS PCT: 0 % (ref 0–1)
Basophils Absolute: 0 10*3/uL (ref 0.0–0.1)
EOS PCT: 2 % (ref 0–5)
Eosinophils Absolute: 0.1 10*3/uL (ref 0.0–0.7)
HCT: 41 % (ref 39.0–52.0)
Hemoglobin: 14.3 g/dL (ref 13.0–17.0)
LYMPHS ABS: 3.2 10*3/uL (ref 0.7–4.0)
LYMPHS PCT: 38 % (ref 12–46)
MCH: 30.3 pg (ref 26.0–34.0)
MCHC: 34.9 g/dL (ref 30.0–36.0)
MCV: 86.9 fL (ref 78.0–100.0)
Monocytes Absolute: 0.6 10*3/uL (ref 0.1–1.0)
Monocytes Relative: 7 % (ref 3–12)
NEUTROS PCT: 53 % (ref 43–77)
Neutro Abs: 4.4 10*3/uL (ref 1.7–7.7)
PLATELETS: 359 10*3/uL (ref 150–400)
RBC: 4.72 MIL/uL (ref 4.22–5.81)
RDW: 14.8 % (ref 11.5–15.5)
WBC: 8.3 10*3/uL (ref 4.0–10.5)

## 2014-09-15 MED ORDER — DILTIAZEM HCL ER COATED BEADS 180 MG PO CP24: 180.0000 mg | ORAL_CAPSULE | Freq: Every day | ORAL | Status: DC

## 2014-09-15 MED ORDER — LISINOPRIL-HYDROCHLOROTHIAZIDE 10-12.5 MG PO TABS: 1.0000 | ORAL_TABLET | Freq: Every day | ORAL | Status: DC

## 2014-09-15 MED ORDER — LISINOPRIL 10 MG PO TABS
10.0000 mg | ORAL_TABLET | Freq: Once | ORAL | Status: AC
Start: 2014-09-15 — End: 2014-09-15
  Administered 2014-09-15: 10 mg via ORAL
  Filled 2014-09-15: qty 1

## 2014-09-15 NOTE — ED Notes (Signed)
The pt has stopped taking his meds for bp for at least 6 months   Sept 2015  His bp is high

## 2014-09-15 NOTE — Discharge Instructions (Signed)

## 2014-09-15 NOTE — ED Provider Notes (Signed)
CSN: [id number]     Arrival date & time 09/15/14  1938 History   First MD Initiated Contact with Patient 09/15/14 2248     Chief Complaint  Patient presents with  . Hypertension     Patient is a 59 y.o. male presenting with hypertension. The history is provided by the patient.  Hypertension   patient presents to the emergency room for evaluation of high blood pressure. Patient was previously diagnosed with hypertension. He stopped taking his medications about 6 months ago. Patient has not been having any symptoms but decided he needed to get this checked out again. Eyes any headache or chest pain. No shortness of breath. No abdominal pain. No swelling. No numbness or weakness. He does not have a primary care doctor  Past Medical History  Diagnosis Date  . Hypertension    History reviewed. No pertinent past surgical history. Family History  Problem Relation Age of Onset  . Cancer Mother   . Diabetes Father   . Glaucoma Father   . Cancer Sister   . Glaucoma Other    History  Substance Use Topics  . Smoking status: Current Every Day Smoker    Types: Cigarettes  . Smokeless tobacco: Not on file  . Alcohol Use: 7.2 oz/week    12 Cans of beer per week     Comment: 12 Pk; 1-2 shots daily     Review of Systems  All other systems reviewed and are negative.     Allergies  Review of patient's allergies indicates no known allergies.  Home Medications   Prior to Admission medications   Medication Sig Start Date End Date Taking? Authorizing Provider  lisinopril-hydrochlorothiazide (PRINZIDE,ZESTORETIC) 10-12.5 MG per tablet Take 1 tablet by mouth daily. 09/15/14   Linwood Dibbles, MD   BP 216/105 mmHg  Pulse 72  Temp(Src) 98.6 F (37 C) (Oral)  Resp 12  Wt 196 lb 4.8 oz (89.041 kg)  SpO2 98% Physical Exam  Constitutional: He appears well-developed and well-nourished. No distress.  HENT:  Head: Normocephalic and atraumatic.  Right Ear: External ear normal.  Left Ear: External  ear normal.  Eyes: Conjunctivae are normal. Right eye exhibits no discharge. Left eye exhibits no discharge. No scleral icterus.  Neck: Neck supple. No tracheal deviation present.  Cardiovascular: Normal rate, regular rhythm and intact distal pulses.   Pulmonary/Chest: Effort normal and breath sounds normal. No stridor. No respiratory distress. He has no wheezes. He has no rales.  Abdominal: Soft. Bowel sounds are normal. He exhibits no distension. There is no tenderness. There is no rebound and no guarding.  Musculoskeletal: He exhibits no edema or tenderness.  Neurological: He is alert. He has normal strength. No cranial nerve deficit (no facial droop, extraocular movements intact, no slurred speech) or sensory deficit. He exhibits normal muscle tone. He displays no seizure activity. Coordination normal.  Skin: Skin is warm and dry. No rash noted.  Psychiatric: He has a normal mood and affect.  Nursing note and vitals reviewed.   ED Course  Procedures (including critical care time) Labs Review Labs Reviewed  COMPREHENSIVE METABOLIC PANEL - Abnormal; Notable for the following:    Sodium 129 (*)    Chloride 96 (*)    BUN <5 (*)    All other components within normal limits  CBC WITH DIFFERENTIAL/PLATELET      MDM   Final diagnoses:  Essential hypertension    Patient is hypertensive but asymptomatic. Laboratory tests are unremarkable other than his sodium of  129.  He is an alcohol drinker.  His daughter brought his old prescriptions.  He used to be on diltiazem 120 and triamterene hctz..  I had given him lisinopril before the daughter arrived.  Will rx the ditiazem.  Hold off on the hctz right now with the sodium.    Linwood DibblesJon Maelle Sheaffer, MD 09/15/14 [phone]

## 2014-09-16 NOTE — ED Notes (Signed)
Dr Lynelle DoctorKnapp aware of patients blood pressure, patient okay to discharge home. Patient and family aware that patient needs to follow up as [facility] as possible with [facility]. Patient denies any chest pain dizziness or cardiac or neurological symptoms.

## 2015-06-07 ENCOUNTER — Inpatient Hospital Stay (HOSPITAL_COMMUNITY)
Admission: EM | Admit: 2015-06-07 | Discharge: 2015-06-10 | DRG: 065 | Disposition: A | Discharge status: Home Health | Payer: Medicaid Other | Attending: Internal Medicine | Admitting: Internal Medicine

## 2015-06-07 ENCOUNTER — Encounter (HOSPITAL_COMMUNITY): Payer: Self-pay | Admitting: *Deleted

## 2015-06-07 ENCOUNTER — Emergency Department (HOSPITAL_COMMUNITY): Payer: Medicaid Other

## 2015-06-07 DIAGNOSIS — I639 Cerebral infarction, unspecified: Secondary | ICD-10-CM | POA: Diagnosis present

## 2015-06-07 DIAGNOSIS — I10 Essential (primary) hypertension: Secondary | ICD-10-CM

## 2015-06-07 DIAGNOSIS — E162 Hypoglycemia, unspecified: Secondary | ICD-10-CM | POA: Diagnosis present

## 2015-06-07 DIAGNOSIS — Z7982 Long term (current) use of aspirin: Secondary | ICD-10-CM

## 2015-06-07 DIAGNOSIS — E785 Hyperlipidemia, unspecified: Secondary | ICD-10-CM | POA: Diagnosis present

## 2015-06-07 DIAGNOSIS — G8194 Hemiplegia, unspecified affecting left nondominant side: Secondary | ICD-10-CM | POA: Diagnosis present

## 2015-06-07 DIAGNOSIS — I63411 Cerebral infarction due to embolism of right middle cerebral artery: Principal | ICD-10-CM | POA: Diagnosis present

## 2015-06-07 DIAGNOSIS — R270 Ataxia, unspecified: Secondary | ICD-10-CM

## 2015-06-07 DIAGNOSIS — F1010 Alcohol abuse, uncomplicated: Secondary | ICD-10-CM

## 2015-06-07 DIAGNOSIS — Z23 Encounter for immunization: Secondary | ICD-10-CM

## 2015-06-07 DIAGNOSIS — M4606 Spinal enthesopathy, lumbar region: Secondary | ICD-10-CM | POA: Diagnosis present

## 2015-06-07 DIAGNOSIS — I672 Cerebral atherosclerosis: Secondary | ICD-10-CM | POA: Diagnosis present

## 2015-06-07 DIAGNOSIS — R001 Bradycardia, unspecified: Secondary | ICD-10-CM | POA: Diagnosis present

## 2015-06-07 DIAGNOSIS — Z79899 Other long term (current) drug therapy: Secondary | ICD-10-CM

## 2015-06-07 DIAGNOSIS — F17210 Nicotine dependence, cigarettes, uncomplicated: Secondary | ICD-10-CM | POA: Diagnosis present

## 2015-06-07 DIAGNOSIS — Z833 Family history of diabetes mellitus: Secondary | ICD-10-CM

## 2015-06-07 DIAGNOSIS — Y902 Blood alcohol level of 40-59 mg/100 ml: Secondary | ICD-10-CM | POA: Diagnosis present

## 2015-06-07 DIAGNOSIS — R4182 Altered mental status, unspecified: Secondary | ICD-10-CM

## 2015-06-07 DIAGNOSIS — I63511 Cerebral infarction due to unspecified occlusion or stenosis of right middle cerebral artery: Secondary | ICD-10-CM | POA: Diagnosis present

## 2015-06-07 DIAGNOSIS — R296 Repeated falls: Secondary | ICD-10-CM | POA: Diagnosis present

## 2015-06-07 HISTORY — DX: Cerebral infarction, unspecified: I63.9

## 2015-06-07 LAB — I-STAT CHEM 8, ED
BUN: 13 mg/dL (ref 6–20)
CALCIUM ION: 1.12 mmol/L — AB (ref 1.13–1.30)
CHLORIDE: 103 mmol/L (ref 101–111)
Creatinine, Ser: 1 mg/dL (ref 0.61–1.24)
Glucose, Bld: 67 mg/dL (ref 65–99)
HEMATOCRIT: 57 % — AB (ref 39.0–52.0)
Hemoglobin: 19.4 g/dL — ABNORMAL HIGH (ref 13.0–17.0)
Potassium: 3.6 mmol/L (ref 3.5–5.1)
SODIUM: 140 mmol/L (ref 135–145)
TCO2: 22 mmol/L (ref 0–100)

## 2015-06-07 LAB — CBC
HCT: 41.8 % (ref 39.0–52.0)
Hemoglobin: 14.1 g/dL (ref 13.0–17.0)
MCH: 30.5 pg (ref 26.0–34.0)
MCHC: 33.7 g/dL (ref 30.0–36.0)
MCV: 90.3 fL (ref 78.0–100.0)
PLATELETS: 332 10*3/uL (ref 150–400)
RBC: 4.63 MIL/uL (ref 4.22–5.81)
RDW: 16.4 % — AB (ref 11.5–15.5)
WBC: 7.9 10*3/uL (ref 4.0–10.5)

## 2015-06-07 LAB — COMPREHENSIVE METABOLIC PANEL
ALBUMIN: 3.7 g/dL (ref 3.5–5.0)
ALT: 13 U/L — ABNORMAL LOW (ref 17–63)
ANION GAP: 12 (ref 5–15)
AST: 19 U/L (ref 15–41)
Alkaline Phosphatase: 87 U/L (ref 38–126)
BUN: 11 mg/dL (ref 6–20)
CHLORIDE: 103 mmol/L (ref 101–111)
CO2: 22 mmol/L (ref 22–32)
Calcium: 9.1 mg/dL (ref 8.9–10.3)
Creatinine, Ser: 1.02 mg/dL (ref 0.61–1.24)
GFR calc non Af Amer: >60 mL/min (ref >60)
GLUCOSE: 73 mg/dL (ref 65–99)
POTASSIUM: 3.9 mmol/L (ref 3.5–5.1)
SODIUM: 137 mmol/L (ref 135–145)
Total Bilirubin: 0.7 mg/dL (ref 0.3–1.2)
Total Protein: 7.4 g/dL (ref 6.5–8.1)

## 2015-06-07 LAB — I-STAT TROPONIN, ED: Troponin i, poc: 0.01 ng/mL (ref 0.00–0.08)

## 2015-06-07 LAB — DIFFERENTIAL
BASOS PCT: 0 %
Basophils Absolute: 0 10*3/uL (ref 0.0–0.1)
EOS PCT: 1 %
Eosinophils Absolute: 0 10*3/uL (ref 0.0–0.7)
Lymphocytes Relative: 30 %
Lymphs Abs: 2.4 10*3/uL (ref 0.7–4.0)
MONO ABS: 0.7 10*3/uL (ref 0.1–1.0)
Monocytes Relative: 9 %
NEUTROS PCT: 60 %
Neutro Abs: 4.8 10*3/uL (ref 1.7–7.7)

## 2015-06-07 LAB — CBG MONITORING, ED
GLUCOSE-CAPILLARY: 105 mg/dL — AB (ref 65–99)
Glucose-Capillary: 45 mg/dL — ABNORMAL LOW (ref 65–99)

## 2015-06-07 LAB — PROTIME-INR
INR: 1.07 (ref 0.00–1.49)
PROTHROMBIN TIME: 14.1 s (ref 11.6–15.2)

## 2015-06-07 LAB — CK: CK TOTAL: 130 U/L (ref 49–397)

## 2015-06-07 LAB — ETHANOL: ALCOHOL ETHYL (B): 43 mg/dL — AB (ref <5)

## 2015-06-07 LAB — APTT: aPTT: 30 seconds (ref 24–37)

## 2015-06-07 MED ORDER — DEXTROSE 50 % IV SOLN
25.0000 mL | Freq: Once | INTRAVENOUS | Status: AC
  Administered 2015-06-07: 25 mL via INTRAVENOUS
  Filled 2015-06-07: qty 50

## 2015-06-07 NOTE — ED Notes (Signed)
Unable to ambulate PT. PT shaky and claims he cannot walk.

## 2015-06-07 NOTE — ED Notes (Signed)
CBG 105. RN notified.

## 2015-06-07 NOTE — ED Notes (Signed)
Pt ambulated in room with PA Dahlia Client.
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
## 2015-06-07 NOTE — ED Notes (Signed)
Pt presents via [facility] from home with BL leg weakness and the inability to walk.  Leg weakness started on 2/22 at 1030am and gradually became worse until he couldn't walk this evening.  Family reports loss of coordination when walking.  Pt also reports the inablilty to "make it to the restroom on time".  EMS reports =grips, no drift, no droop.  Bp-204/110 P-70s, EKG unremarkable, CBG-97.  Hx: htn and noncompliance.  Pt reports drinking ETOH today-1 quart of beer (his normal).   PT a x 4, NAD.

## 2015-06-07 NOTE — ED Notes (Signed)
CBG 45. RN NOTIFIED.

## 2015-06-07 NOTE — ED Notes (Signed)
Notified Dr. Fredderick Phenix of CBG 45. Received order to give patient food and fluids, and 1/2 amp D50.

## 2015-06-08 ENCOUNTER — Encounter (HOSPITAL_COMMUNITY): Payer: Self-pay | Admitting: General Practice

## 2015-06-08 ENCOUNTER — Inpatient Hospital Stay (HOSPITAL_COMMUNITY): Payer: Medicaid Other

## 2015-06-08 ENCOUNTER — Observation Stay (HOSPITAL_COMMUNITY): Payer: Medicaid Other

## 2015-06-08 DIAGNOSIS — I10 Essential (primary) hypertension: Secondary | ICD-10-CM | POA: Diagnosis not present

## 2015-06-08 DIAGNOSIS — G8194 Hemiplegia, unspecified affecting left nondominant side: Secondary | ICD-10-CM | POA: Diagnosis present

## 2015-06-08 DIAGNOSIS — F1010 Alcohol abuse, uncomplicated: Secondary | ICD-10-CM

## 2015-06-08 DIAGNOSIS — Z833 Family history of diabetes mellitus: Secondary | ICD-10-CM | POA: Diagnosis not present

## 2015-06-08 DIAGNOSIS — Z23 Encounter for immunization: Secondary | ICD-10-CM | POA: Diagnosis not present

## 2015-06-08 DIAGNOSIS — M4606 Spinal enthesopathy, lumbar region: Secondary | ICD-10-CM | POA: Diagnosis present

## 2015-06-08 DIAGNOSIS — R4182 Altered mental status, unspecified: Secondary | ICD-10-CM | POA: Diagnosis not present

## 2015-06-08 DIAGNOSIS — R296 Repeated falls: Secondary | ICD-10-CM | POA: Diagnosis present

## 2015-06-08 DIAGNOSIS — E785 Hyperlipidemia, unspecified: Secondary | ICD-10-CM | POA: Diagnosis present

## 2015-06-08 DIAGNOSIS — I6350 Cerebral infarction due to unspecified occlusion or stenosis of unspecified cerebral artery: Secondary | ICD-10-CM | POA: Diagnosis not present

## 2015-06-08 DIAGNOSIS — I63511 Cerebral infarction due to unspecified occlusion or stenosis of right middle cerebral artery: Secondary | ICD-10-CM | POA: Diagnosis present

## 2015-06-08 DIAGNOSIS — F17210 Nicotine dependence, cigarettes, uncomplicated: Secondary | ICD-10-CM | POA: Diagnosis present

## 2015-06-08 DIAGNOSIS — I672 Cerebral atherosclerosis: Secondary | ICD-10-CM | POA: Diagnosis present

## 2015-06-08 DIAGNOSIS — I639 Cerebral infarction, unspecified: Secondary | ICD-10-CM

## 2015-06-08 DIAGNOSIS — I63411 Cerebral infarction due to embolism of right middle cerebral artery: Secondary | ICD-10-CM | POA: Diagnosis present

## 2015-06-08 DIAGNOSIS — R001 Bradycardia, unspecified: Secondary | ICD-10-CM | POA: Diagnosis present

## 2015-06-08 DIAGNOSIS — Z79899 Other long term (current) drug therapy: Secondary | ICD-10-CM | POA: Diagnosis not present

## 2015-06-08 DIAGNOSIS — Y902 Blood alcohol level of 40-59 mg/100 ml: Secondary | ICD-10-CM | POA: Diagnosis present

## 2015-06-08 DIAGNOSIS — E162 Hypoglycemia, unspecified: Secondary | ICD-10-CM | POA: Diagnosis present

## 2015-06-08 DIAGNOSIS — Z7982 Long term (current) use of aspirin: Secondary | ICD-10-CM | POA: Diagnosis not present

## 2015-06-08 DIAGNOSIS — R270 Ataxia, unspecified: Secondary | ICD-10-CM

## 2015-06-08 LAB — CBC
HCT: 42.2 % (ref 39.0–52.0)
HEMATOCRIT: 41.1 % (ref 39.0–52.0)
Hemoglobin: 13.6 g/dL (ref 13.0–17.0)
Hemoglobin: 14.6 g/dL (ref 13.0–17.0)
MCH: 31.6 pg (ref 26.0–34.0)
MCH: 30.4 pg (ref 26.0–34.0)
MCHC: 33.1 g/dL (ref 30.0–36.0)
MCHC: 34.6 g/dL (ref 30.0–36.0)
MCV: 91.3 fL (ref 78.0–100.0)
MCV: 91.7 fL (ref 78.0–100.0)
PLATELETS: 306 10*3/uL (ref 150–400)
PLATELETS: 314 10*3/uL (ref 150–400)
RBC: 4.48 MIL/uL (ref 4.22–5.81)
RBC: 4.62 MIL/uL (ref 4.22–5.81)
RDW: 16.5 % — AB (ref 11.5–15.5)
RDW: 16.6 % — AB (ref 11.5–15.5)
WBC: 7 10*3/uL (ref 4.0–10.5)
WBC: 7.6 10*3/uL (ref 4.0–10.5)

## 2015-06-08 LAB — CBG MONITORING, ED
Glucose-Capillary: 75 mg/dL (ref 65–99)
Glucose-Capillary: 81 mg/dL (ref 65–99)

## 2015-06-08 LAB — TSH: TSH: 2.022 u[IU]/mL (ref 0.350–4.500)

## 2015-06-08 LAB — BASIC METABOLIC PANEL
Anion gap: 13 (ref 5–15)
BUN: 10 mg/dL (ref 6–20)
CHLORIDE: 102 mmol/L (ref 101–111)
CO2: 23 mmol/L (ref 22–32)
Calcium: 9.1 mg/dL (ref 8.9–10.3)
Creatinine, Ser: 0.88 mg/dL (ref 0.61–1.24)
GFR calc Af Amer: >60 mL/min (ref >60)
GFR calc non Af Amer: >60 mL/min (ref >60)
GLUCOSE: 81 mg/dL (ref 65–99)
POTASSIUM: 4 mmol/L (ref 3.5–5.1)
Sodium: 138 mmol/L (ref 135–145)

## 2015-06-08 LAB — HIV ANTIBODY (ROUTINE TESTING W REFLEX): HIV SCREEN 4TH GENERATION: NONREACTIVE

## 2015-06-08 LAB — TROPONIN I
Troponin I: <0.03 ng/mL (ref <0.031)
Troponin I: <0.03 ng/mL (ref <0.031)

## 2015-06-08 LAB — GLUCOSE, CAPILLARY
GLUCOSE-CAPILLARY: 87 mg/dL (ref 65–99)
Glucose-Capillary: 78 mg/dL (ref 65–99)

## 2015-06-08 LAB — CREATININE, SERUM
Creatinine, Ser: 0.93 mg/dL (ref 0.61–1.24)
GFR calc Af Amer: >60 mL/min (ref >60)
GFR calc non Af Amer: >60 mL/min (ref >60)

## 2015-06-08 LAB — MAGNESIUM: Magnesium: 2.3 mg/dL (ref 1.7–2.4)

## 2015-06-08 LAB — RPR: RPR Ser Ql: NONREACTIVE

## 2015-06-08 LAB — C-REACTIVE PROTEIN: CRP: 1.6 mg/dL — AB (ref <1.0)

## 2015-06-08 LAB — SEDIMENTATION RATE: SED RATE: 13 mm/h (ref 0–16)

## 2015-06-08 LAB — PROTIME-INR
INR: 1.1 (ref 0.00–1.49)
PROTHROMBIN TIME: 14.4 s (ref 11.6–15.2)

## 2015-06-08 LAB — VITAMIN B12: Vitamin B-12: 207 pg/mL (ref 180–914)

## 2015-06-08 MED ORDER — ACETAMINOPHEN 650 MG RE SUPP: 650.0000 mg | Freq: Four times a day (QID) | RECTAL | Status: DC | PRN

## 2015-06-08 MED ORDER — ONDANSETRON HCL 4 MG/2ML IJ SOLN: 4.0000 mg | Freq: Four times a day (QID) | INTRAMUSCULAR | Status: DC | PRN

## 2015-06-08 MED ORDER — PNEUMOCOCCAL VAC POLYVALENT 25 MCG/0.5ML IJ INJ
0.5000 mL | INJECTION | INTRAMUSCULAR | Status: AC
  Administered 2015-06-09: 0.5 mL via INTRAMUSCULAR
  Filled 2015-06-08: qty 0.5

## 2015-06-08 MED ORDER — LORAZEPAM 2 MG/ML IJ SOLN
2.0000 mg | INTRAMUSCULAR | Status: DC | PRN
  Administered 2015-06-08 – 2015-06-10 (×3): 2 mg via INTRAVENOUS
  Filled 2015-06-08 (×3): qty 1

## 2015-06-08 MED ORDER — VITAMIN B-1 100 MG PO TABS
100.0000 mg | ORAL_TABLET | Freq: Every day | ORAL | Status: DC
  Administered 2015-06-08 – 2015-06-10 (×3): 100 mg via ORAL
  Filled 2015-06-08 (×3): qty 1

## 2015-06-08 MED ORDER — SENNOSIDES-DOCUSATE SODIUM 8.6-50 MG PO TABS: 1.0000 | ORAL_TABLET | Freq: Every evening | ORAL | Status: DC | PRN

## 2015-06-08 MED ORDER — ACETAMINOPHEN 325 MG PO TABS: 650.0000 mg | ORAL_TABLET | Freq: Four times a day (QID) | ORAL | Status: DC | PRN

## 2015-06-08 MED ORDER — ASPIRIN 325 MG PO TABS
325.0000 mg | ORAL_TABLET | Freq: Every day | ORAL | Status: DC
  Administered 2015-06-08 – 2015-06-10 (×3): 325 mg via ORAL
  Filled 2015-06-08 (×3): qty 1

## 2015-06-08 MED ORDER — ADULT MULTIVITAMIN W/MINERALS CH
1.0000 | ORAL_TABLET | Freq: Every day | ORAL | Status: DC
  Administered 2015-06-08 – 2015-06-10 (×3): 1 via ORAL
  Filled 2015-06-08 (×3): qty 1

## 2015-06-08 MED ORDER — POTASSIUM CHLORIDE IN NACL 20-0.45 MEQ/L-% IV SOLN
INTRAVENOUS | Status: DC
  Administered 2015-06-08: 03:00:00 via INTRAVENOUS
  Filled 2015-06-08 (×7): qty 1000

## 2015-06-08 MED ORDER — ENOXAPARIN SODIUM 40 MG/0.4ML ~~LOC~~ SOLN: 40.0000 mg | SUBCUTANEOUS | Status: DC

## 2015-06-08 MED ORDER — POLYETHYLENE GLYCOL 3350 17 G PO PACK
17.0000 g | PACK | Freq: Every day | ORAL | Status: DC | PRN
  Filled 2015-06-08 (×2): qty 1

## 2015-06-08 MED ORDER — THIAMINE HCL 100 MG/ML IJ SOLN
Freq: Once | INTRAVENOUS | Status: AC
  Administered 2015-06-08: 03:00:00 via INTRAVENOUS
  Filled 2015-06-08: qty 1000

## 2015-06-08 MED ORDER — DILTIAZEM HCL ER COATED BEADS 180 MG PO CP24
180.0000 mg | ORAL_CAPSULE | Freq: Every day | ORAL | Status: DC
  Administered 2015-06-08 – 2015-06-09 (×2): 180 mg via ORAL
  Filled 2015-06-08 (×2): qty 1

## 2015-06-08 MED ORDER — PANTOPRAZOLE SODIUM 40 MG PO TBEC
40.0000 mg | DELAYED_RELEASE_TABLET | Freq: Two times a day (BID) | ORAL | Status: DC
  Administered 2015-06-08 – 2015-06-10 (×5): 40 mg via ORAL
  Filled 2015-06-08 (×5): qty 1

## 2015-06-08 MED ORDER — ENOXAPARIN SODIUM 40 MG/0.4ML ~~LOC~~ SOLN
40.0000 mg | SUBCUTANEOUS | Status: DC
  Administered 2015-06-08 – 2015-06-09 (×2): 40 mg via SUBCUTANEOUS
  Filled 2015-06-08 (×3): qty 0.4

## 2015-06-08 MED ORDER — FOLIC ACID 1 MG PO TABS
1.0000 mg | ORAL_TABLET | Freq: Every day | ORAL | Status: DC
  Administered 2015-06-08 – 2015-06-10 (×3): 1 mg via ORAL
  Filled 2015-06-08 (×3): qty 1

## 2015-06-08 MED ORDER — MAGNESIUM SULFATE 2 GM/50ML IV SOLN
2.0000 g | Freq: Once | INTRAVENOUS | Status: AC
  Administered 2015-06-08: 2 g via INTRAVENOUS
  Filled 2015-06-08: qty 50

## 2015-06-08 MED ORDER — ALUM & MAG HYDROXIDE-SIMETH 200-200-20 MG/5ML PO SUSP: 30.0000 mL | Freq: Four times a day (QID) | ORAL | Status: DC | PRN

## 2015-06-08 MED ORDER — STROKE: EARLY STAGES OF RECOVERY BOOK
Freq: Once | Status: DC
  Filled 2015-06-08: qty 1

## 2015-06-08 MED ORDER — HYDRALAZINE HCL 20 MG/ML IJ SOLN: 5.0000 mg | INTRAMUSCULAR | Status: DC | PRN

## 2015-06-08 MED ORDER — ONDANSETRON HCL 4 MG PO TABS: 4.0000 mg | ORAL_TABLET | Freq: Four times a day (QID) | ORAL | Status: DC | PRN

## 2015-06-08 MED ORDER — ENOXAPARIN SODIUM 40 MG/0.4ML ~~LOC~~ SOLN
40.0000 mg | SUBCUTANEOUS | Status: DC
  Filled 2015-06-08: qty 0.4

## 2015-06-08 NOTE — ED Notes (Signed)
Patient 's linens were changed warm blankets given. repositioned in bed. Clear liquid breakfast given.

## 2015-06-08 NOTE — ED Notes (Signed)
Lab called at reports that a Vit E test that was sent down around 3am today was mixed with other tubes and was to be protected by light. Tubes weren't protected once in lab so redraw needed

## 2015-06-08 NOTE — Progress Notes (Signed)
Utilization review completed.  L. J. Vane Yapp RN, BSN, CM 

## 2015-06-08 NOTE — ED Notes (Signed)
Family and patient requesting to speak to someone discussing POA. Ray- Chaplain notified sts will come down to discuss advanced directives. Patient and family updated.

## 2015-06-08 NOTE — ED Notes (Signed)
CBG= 81. Pt resting with eyes closed. resp even and unlabored. Skin w/d.

## 2015-06-08 NOTE — H&P (Signed)
[facility] History and Physical  Jose Neal PHX:[id number] DOB: 02/08/1956 DOA: 06/07/2015  Referring physician: Dr. Tamera Punt    PCP: [facility]   Chief Complaint: Loss of balance, etoh abuse  HPI: Jose Neal is a 60 y.o. male with hx of HTN and ETOH abuse who presents to ED brought by his daughters for bilat LE weakness and inability to walk.  Has been staying w other siblings up until the last 24 hours and drinking ETOH up until yesterday.  Patient says having difficulty getting to the bathroom intime.  Pt reports drinking a "six-pack" of beer a day, his daughter says he drinks "a lot more than that".  In ED VS are stable, and labs unremarkable.    Patient denies any recent issues with CP, cough, SOB, n/v/d , abd pain .  No dysuria, no HA, no blurred vision.  No seizure activity or jerking.  No hx liver disease. No OTC meds.    [facility] for admission at [facility].    ROS  no rash  no joitn pain  no bleeding  Where does patient live with family Can patient participate in ADLs? Not currently   Past Medical History  Past Medical History  Diagnosis Date  . Hypertension    Past Surgical History History reviewed. No pertinent past surgical history. Family History  Family History  Problem Relation Age of Onset  . Cancer Mother   . Diabetes Father   . Glaucoma Father   . Cancer Sister   . Glaucoma Other    Social History  reports that he has been smoking Cigarettes.  He does not have any smokeless tobacco history on file. He reports that he drinks about 7.2 oz of alcohol per week. He reports that he does not use illicit drugs. Allergies No Known Allergies Home medications Prior to Admission medications   Medication Sig Start Date End Date Taking? Authorizing Provider  diltiazem (CARDIZEM CD) 180 MG 24 hr capsule Take 1 capsule (180 mg total) by mouth daily. 09/15/14  Yes Dorie Rank, MD   Liver Function Tests  Recent Labs Lab 06/07/15 2031  AST  19  ALT 13*  ALKPHOS 87  BILITOT 0.7  PROT 7.4  ALBUMIN 3.7   No results for input(s): LIPASE, AMYLASE in the last 168 hours. CBC  Recent Labs Lab 06/07/15 2021 06/07/15 2031  WBC  --  7.9  NEUTROABS  --  4.8  HGB 19.4* 14.1  HCT 57.0* 41.8  MCV  --  90.3  PLT  --  016   Basic Metabolic Panel  Recent Labs Lab 06/07/15 2021 06/07/15 2031  NA 140 137  K 3.6 3.9  CL 103 103  CO2  --  22  GLUCOSE 67 73  BUN 13 11  CREATININE 1.00 1.02  CALCIUM  --  9.1     Filed Vitals:   06/07/15 2100 06/07/15 2145 06/07/15 2215 06/07/15 2330  BP: 187/94 177/91 167/81 180/91  Pulse: 67 73 65   Temp:      TempSrc:      Resp: 20 23 23 26   Height:      Weight:      SpO2: 98% 98% 98%    Exam: Awake, looks disheveled and tired, responds to most commands appropriately  No rash, cyanosis or gangrene Sclera anicteric, throat clear No jvd, or bruits Chest is clear bilat RRR no MRG ABd soft ntnd no hsm or ascites +bs GU normal male MS  no joint effusion Ext no edema, wounds or ulcers Neuro -- very ataxic gait, unable to stand on his own.   FTN and HTS normal bilat Strength 4+/5 and symmetric bilat UE's and LE's, nonfocal Ox 2   Home medications > Cardizem CD 180 mg/ 24 hr  Na 137  K 3.9  BUN 11 Creat 1.02  CA 9.1  Alb 3.7 LFT's ok WBC 7k Hb 14  plt 332  Assessment: 1 Ataxia - recent onset, subacute. Head CT neg for bleed. No CVA by CT either. Long hx ETOH abuse most likely, will test for other possible reversible causes.  For MRI this evening per ER MD order.     2 ETOH abuse - longstanding 3 HTN takes diltiazem CD  Plan - ESR, CRP, TSH, vit B12, vit E, serum copper, HIV, RPR.  Give IVF's and thiamine. Consider neuro input.    DVT Prophylaxis lovenox SQ  Code Status: full  Family Communication: at bedside  Disposition Plan: home when stable    [facility] Pager [phone]  Cell [phone]  If 7PM-7AM, please contact  night-coverage [email or web address] Password [facility] 06/08/2015, 12:43 AM

## 2015-06-08 NOTE — Progress Notes (Addendum)
Patient seen and examined   60 y.o. male with hx of HTN and ETOH abuse who presents to ED brought by his daughters for bilat LE weakness and inability to walk. Has been staying w other siblings up until the last 24 hours and drinking ETOH up until yesterday.level is 43 .   Pt reports drinking a "six-pack" of beer a day, his daughter says he drinks "a lot more than that". In ED VS are stable, and labs unremarkable.   Patient denies any recent issues with CP, cough, SOB, n/v/d , abd pain . No dysuria, no HA, no blurred vision. No seizure activity or jerking. No hx liver disease. No OTC meds.    Assessment: 1 Ataxia - recent onset, subacute. Head CT neg for bleed. No CVA by CT  . Long hx ETOH abuse most likely, will test for other possible reversible causes.  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

## 2015-06-08 NOTE — Progress Notes (Addendum)
Responded to page to assist patient and family with Advance Directives. Left form for their review. I advised  Patient's.me daughter to have nurse page Chaplain once form is complete. Available as needed

## 2015-06-08 NOTE — ED Notes (Signed)
Admitting physician at bedside with patient/family.

## 2015-06-08 NOTE — Consult Note (Signed)
Admission H&P    Chief Complaint: Acute onset of gait difficulty.  HPI: Jose Neal is an 60 y.o. male history of hypertension and alcohol abuse brought to the hospital for evaluation of new onset lower extremity weakness and unstable gait. Patient reportedly stopped drinking about 24 hours prior to presentation. Alcohol level in the ED was 43. CT scan of his head was unremarkable. 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 MRA showed intracranial atherosclerotic changes affecting right M1 segment primarily. Tandem lesions with estimated 50-75% stenosis were noted.  LSN: 06/06/2015 at 10:30 AM tPA Given: No: Beyond time under for treatment consideration mRankin:  Past Medical History  Diagnosis Date  . Hypertension     History reviewed. No pertinent past surgical history.  Family History  Problem Relation Age of Onset  . Cancer Mother   . Diabetes Father   . Glaucoma Father   . Cancer Sister   . Glaucoma Other    Social History:  reports that he has been smoking Cigarettes.  He does not have any smokeless tobacco history on file. He reports that he drinks about 7.2 oz of alcohol per week. He reports that he does not use illicit drugs.  Allergies: No Known Allergies  Medications Prior to Admission  Medication Sig Dispense Refill  . diltiazem (CARDIZEM CD) 180 MG 24 hr capsule Take 1 capsule (180 mg total) by mouth daily. 30 capsule 1    ROS: History obtained from the patient Unreliable due to patient's mental status abnormalities with disorientation.  Physical Examination: Blood pressure 188/80, pulse 54, temperature 98.4 F (36.9 C), temperature source Oral, resp. rate 16, height _0  (1.854 m), weight 86.183 kg (190 lb), SpO2 93 %.  HEENT-  Normocephalic, no lesions, without obvious abnormality.  Normal external eye and conjunctiva.  Normal TM's bilaterally.  Normal auditory  canals and external ears. Normal external nose, mucus membranes and septum.  Normal pharynx. Neck supple with no masses, nodes, nodules or enlargement. Cardiovascular - regular rate and rhythm, S1, S2 normal, no murmur, click, rub or gallop Lungs - chest clear, no wheezing, rales, normal symmetric air entry Abdomen - soft, non-tender; bowel sounds normal; no masses,  no organomegaly Extremities - no joint deformities, effusion, or inflammation and no edema  Neurologic Examination: Mental Status: Alert, disoriented to current month, no acute distress.  Speech fluent without evidence of aphasia. Able to follow commands without difficulty. Cranial Nerves: II-Visual fields were normal. III/IV/VI-Pupils were equal and reacted normally to light. Extraocular movements were full and conjugate.    V/VII-no facial numbness and no facial weakness. VIII-normal. X-normal speech and symmetrical palatal movement. XI: trapezius strength/neck flexion strength normal bilaterally XII-midline tongue extension with normal strength. Motor: Mild drift of left upper and lower extremities with mild proximal weakness; motor exam otherwise unremarkable Sensory: Normal throughout. Deep Tendon Reflexes: 1+ and symmetric. Plantars: Flexor on the right and mute on the left area Cerebellar: Minimally impaired finger to nose testing bilaterally. Carotid auscultation: Normal  Results for orders placed or performed during the hospital encounter of 06/07/15 (from the past 48 hour(s))  Ethanol     Status: Abnormal   Collection Time: 06/07/15  7:32 PM  Result Value Ref Range   Alcohol, Ethyl (B) 43 (H) <5 mg/dL    Comment:        LOWEST DETECTABLE LIMIT FOR SERUM ALCOHOL IS 5 mg/dL FOR MEDICAL PURPOSES ONLY  CBG monitoring, ED     Status: Abnormal   Collection Time: 06/07/15  7:56 PM  Result Value Ref Range   Glucose-Capillary 45 (L) 65 - 99 mg/dL  I-stat troponin, ED (not at [facility])     Status: None    Collection Time: 06/07/15  8:00 PM  Result Value Ref Range   Troponin i, poc 0.01 0.00 - 0.08 ng/mL   Comment 3            Comment: Due to the release kinetics of cTnI, a negative result within the first hours of the onset of symptoms does not rule out myocardial infarction with certainty. If myocardial infarction is still suspected, repeat the test at appropriate intervals.   I-Stat Chem 8, ED  (not at [facility])     Status: Abnormal   Collection Time: 06/07/15  8:21 PM  Result Value Ref Range   Sodium 140 135 - 145 mmol/L   Potassium 3.6 3.5 - 5.1 mmol/L   Chloride 103 101 - 111 mmol/L   BUN 13 6 - 20 mg/dL   Creatinine, Ser 1.00 0.61 - 1.24 mg/dL   Glucose, Bld 67 65 - 99 mg/dL   Calcium, Ion 1.12 (L) 1.13 - 1.30 mmol/L   TCO2 22 0 - 100 mmol/L   Hemoglobin 19.4 (H) 13.0 - 17.0 g/dL   HCT 57.0 (H) 39.0 - 52.0 %  Protime-INR     Status: None   Collection Time: 06/07/15  8:31 PM  Result Value Ref Range   Prothrombin Time 14.1 11.6 - 15.2 seconds   INR 1.07 0.00 - 1.49  APTT     Status: None   Collection Time: 06/07/15  8:31 PM  Result Value Ref Range   aPTT 30 24 - 37 seconds  CBC     Status: Abnormal   Collection Time: 06/07/15  8:31 PM  Result Value Ref Range   WBC 7.9 4.0 - 10.5 K/uL   RBC 4.63 4.22 - 5.81 MIL/uL   Hemoglobin 14.1 13.0 - 17.0 g/dL    Comment: REPEATED TO VERIFY   HCT 41.8 39.0 - 52.0 %   MCV 90.3 78.0 - 100.0 fL   MCH 30.5 26.0 - 34.0 pg   MCHC 33.7 30.0 - 36.0 g/dL   RDW 16.4 (H) 11.5 - 15.5 %   Platelets 332 150 - 400 K/uL  Differential     Status: None   Collection Time: 06/07/15  8:31 PM  Result Value Ref Range   Neutrophils Relative % 60 %   Neutro Abs 4.8 1.7 - 7.7 K/uL   Lymphocytes Relative 30 %   Lymphs Abs 2.4 0.7 - 4.0 K/uL   Monocytes Relative 9 %   Monocytes Absolute 0.7 0.1 - 1.0 K/uL   Eosinophils Relative 1 %   Eosinophils Absolute 0.0 0.0 - 0.7 K/uL   Basophils Relative 0 %   Basophils Absolute 0.0 0.0 - 0.1 K/uL   Comprehensive metabolic panel     Status: Abnormal   Collection Time: 06/07/15  8:31 PM  Result Value Ref Range   Sodium 137 135 - 145 mmol/L   Potassium 3.9 3.5 - 5.1 mmol/L   Chloride 103 101 - 111 mmol/L   CO2 22 22 - 32 mmol/L   Glucose, Bld 73 65 - 99 mg/dL   BUN 11 6 - 20 mg/dL   Creatinine, Ser 1.02 0.61 - 1.24 mg/dL   Calcium 9.1 8.9 - 10.3 mg/dL   Total Protein 7.4 6.5 -  8.1 g/dL   Albumin 3.7 3.5 - 5.0 g/dL   AST 19 15 - 41 U/L   ALT 13 (L) 17 - 63 U/L   Alkaline Phosphatase 87 38 - 126 U/L   Total Bilirubin 0.7 0.3 - 1.2 mg/dL   GFR calc non Af Amer >60 >60 mL/min   GFR calc Af Amer >60 >60 mL/min    Comment: (NOTE) The eGFR has been calculated using the CKD EPI equation. This calculation has not been validated in all clinical situations. eGFR's persistently <60 mL/min signify possible Chronic Kidney Disease.    Anion gap 12 5 - 15  CK     Status: None   Collection Time: 06/07/15  8:31 PM  Result Value Ref Range   Total CK 130 49 - 397 U/L  CBG monitoring, ED     Status: Abnormal   Collection Time: 06/07/15  9:05 PM  Result Value Ref Range   Glucose-Capillary 105 (H) 65 - 99 mg/dL  CBG monitoring, ED     Status: None   Collection Time: 06/08/15  2:06 AM  Result Value Ref Range   Glucose-Capillary 75 65 - 99 mg/dL  CBC     Status: Abnormal   Collection Time: 06/08/15  3:14 AM  Result Value Ref Range   WBC 7.6 4.0 - 10.5 K/uL   RBC 4.62 4.22 - 5.81 MIL/uL   Hemoglobin 14.6 13.0 - 17.0 g/dL   HCT 42.2 39.0 - 52.0 %   MCV 91.3 78.0 - 100.0 fL   MCH 31.6 26.0 - 34.0 pg   MCHC 34.6 30.0 - 36.0 g/dL   RDW 16.5 (H) 11.5 - 15.5 %   Platelets 314 150 - 400 K/uL  Creatinine, serum     Status: None   Collection Time: 06/08/15  3:14 AM  Result Value Ref Range   Creatinine, Ser 0.93 0.61 - 1.24 mg/dL   GFR calc non Af Amer >60 >60 mL/min   GFR calc Af Amer >60 >60 mL/min    Comment: (NOTE) The eGFR has been calculated using the CKD EPI equation. This  calculation has not been validated in all clinical situations. eGFR's persistently <60 mL/min signify possible Chronic Kidney Disease.   Basic metabolic panel     Status: None   Collection Time: 06/08/15  3:14 AM  Result Value Ref Range   Sodium 138 135 - 145 mmol/L   Potassium 4.0 3.5 - 5.1 mmol/L   Chloride 102 101 - 111 mmol/L   CO2 23 22 - 32 mmol/L   Glucose, Bld 81 65 - 99 mg/dL   BUN 10 6 - 20 mg/dL   Creatinine, Ser 0.88 0.61 - 1.24 mg/dL   Calcium 9.1 8.9 - 10.3 mg/dL   GFR calc non Af Amer >60 >60 mL/min   GFR calc Af Amer >60 >60 mL/min    Comment: (NOTE) The eGFR has been calculated using the CKD EPI equation. This calculation has not been validated in all clinical situations. eGFR's persistently <60 mL/min signify possible Chronic Kidney Disease.    Anion gap 13 5 - 15  CBC     Status: Abnormal   Collection Time: 06/08/15  3:14 AM  Result Value Ref Range   WBC 7.0 4.0 - 10.5 K/uL   RBC 4.48 4.22 - 5.81 MIL/uL   Hemoglobin 13.6 13.0 - 17.0 g/dL   HCT 41.1 39.0 - 52.0 %   MCV 91.7 78.0 - 100.0 fL   MCH 30.4 26.0 - 34.0 pg  MCHC 33.1 30.0 - 36.0 g/dL   RDW 16.6 (H) 11.5 - 15.5 %   Platelets 306 150 - 400 K/uL  Protime-INR     Status: None   Collection Time: 06/08/15  3:14 AM  Result Value Ref Range   Prothrombin Time 14.4 11.6 - 15.2 seconds   INR 1.10 0.00 - 1.49  Sedimentation rate     Status: None   Collection Time: 06/08/15  3:15 AM  Result Value Ref Range   Sed Rate 13 0 - 16 mm/hr  C-reactive protein     Status: Abnormal   Collection Time: 06/08/15  3:15 AM  Result Value Ref Range   CRP 1.6 (H) <1.0 mg/dL  TSH     Status: None   Collection Time: 06/08/15  3:15 AM  Result Value Ref Range   TSH 2.022 0.350 - 4.500 uIU/mL  Vitamin B12     Status: None   Collection Time: 06/08/15  3:15 AM  Result Value Ref Range   Vitamin B-12 207 180 - 914 pg/mL    Comment: (NOTE) This assay is not validated for testing neonatal or myeloproliferative syndrome  specimens for Vitamin B12 levels.   HIV antibody     Status: None   Collection Time: 06/08/15  3:15 AM  Result Value Ref Range   HIV Screen 4th Generation wRfx Non Reactive Non Reactive    Comment: (NOTE) Performed At: [facility] [place], [place] [id number] Lindon Romp MD OA:[id number]   RPR     Status: None   Collection Time: 06/08/15  3:15 AM  Result Value Ref Range   RPR Ser Ql Non Reactive Non Reactive    Comment: (NOTE) Performed At: [facility] [address] [place], [place] [id number] Lindon Romp MD DD:[id number]   CBG monitoring, ED     Status: None   Collection Time: 06/08/15  4:48 AM  Result Value Ref Range   Glucose-Capillary 81 65 - 99 mg/dL  Magnesium     Status: None   Collection Time: 06/08/15  5:05 PM  Result Value Ref Range   Magnesium 2.3 1.7 - 2.4 mg/dL  Troponin I     Status: None   Collection Time: 06/08/15  5:05 PM  Result Value Ref Range   Troponin I <0.03 <0.031 ng/mL    Comment:        NO INDICATION OF MYOCARDIAL INJURY.   Glucose, capillary     Status: None   Collection Time: 06/08/15  7:01 PM  Result Value Ref Range   Glucose-Capillary 87 65 - 99 mg/dL   Dg Chest 2 View  06/07/2015  CLINICAL DATA:  Shortness of breath for 1 day EXAM: CHEST  2 VIEW COMPARISON:  None. FINDINGS: There is no edema or consolidation. The heart size and pulmonary vascularity are normal. No adenopathy. No bone lesions. IMPRESSION: No edema or consolidation. Electronically Signed   By: Lowella Grip III M.D.   On: 06/07/2015 19:07   Dg Lumbar Spine Complete  06/07/2015  CLINICAL DATA:  Pt reports weakness in legs and SOB on exertion x 1 day. Pt also c/o stiffness in lumbar spine x a few months. No known injury to spine. Hx HTN not controlled with medication and current smoker of 1/2 PPD. EXAM: LUMBAR SPINE - COMPLETE 4+ VIEW COMPARISON:  None. FINDINGS: No fracture.  No spondylolisthesis. Moderate loss of disc height with  endplate spurring at C3-J6. Remaining disc spaces are well preserved. No bone lesion.  There are vascular calcifications in the pelvis. Soft tissues are otherwise unremarkable. IMPRESSION: 1. No fracture or acute finding.  No bone lesion. 2. Disc degenerative change at L5-S1. Electronically Signed   By: Lajean Manes M.D.   On: 06/07/2015 19:08   Ct Head Wo Contrast  06/07/2015  CLINICAL DATA:  Bilateral leg weakness. Unable to walk. Multiple falls. History of hypertension. EXAM: CT HEAD WITHOUT CONTRAST CT CERVICAL SPINE WITHOUT CONTRAST TECHNIQUE: Multidetector CT imaging of the head and cervical spine was performed following the standard protocol without intravenous contrast. Multiplanar CT image reconstructions of the cervical spine were also generated. COMPARISON:  None. FINDINGS: CT HEAD FINDINGS The ventricles are normal configuration. There is ventricular and sulcal enlargement reflecting mild to moderate atrophy, advanced for age. There are no parenchymal masses or mass effect. There is no evidence of a cortical infarct. There is a small lacune infarct in the left caudate nucleus head. Patchy white matter hypoattenuation is noted consistent with mild chronic microvascular ischemic change. There are no extra-axial masses or abnormal fluid collections. There is no intracranial hemorrhage. No skull fracture. Visualized sinuses and mastoid air cells are clear. CT CERVICAL SPINE FINDINGS No fracture. No spondylolisthesis. There is mild loss disc height most evident at C5-C6. Endplate osteophytes are noted from C4 through the upper thoracic spine. The bones are demineralized. Central spinal canal is relatively well preserved as are the neural foramina. There is straightening of the normal cervical lordosis. Soft tissues are unremarkable. The lung apices show mild centrilobular and paraseptal emphysema. IMPRESSION: HEAD CT: No acute intracranial abnormalities. Advanced atrophy for age and mild chronic  microvascular ischemic change. CERVICAL CT:  No fracture or acute finding. Electronically Signed   By: Lajean Manes M.D.   On: 06/07/2015 20:35   Ct Cervical Spine Wo Contrast  06/07/2015  CLINICAL DATA:  Bilateral leg weakness. Unable to walk. Multiple falls. History of hypertension. EXAM: CT HEAD WITHOUT CONTRAST CT CERVICAL SPINE WITHOUT CONTRAST TECHNIQUE: Multidetector CT imaging of the head and cervical spine was performed following the standard protocol without intravenous contrast. Multiplanar CT image reconstructions of the cervical spine were also generated. COMPARISON:  None. FINDINGS: CT HEAD FINDINGS The ventricles are normal configuration. There is ventricular and sulcal enlargement reflecting mild to moderate atrophy, advanced for age. There are no parenchymal masses or mass effect. There is no evidence of a cortical infarct. There is a small lacune infarct in the left caudate nucleus head. Patchy white matter hypoattenuation is noted consistent with mild chronic microvascular ischemic change. There are no extra-axial masses or abnormal fluid collections. There is no intracranial hemorrhage. No skull fracture. Visualized sinuses and mastoid air cells are clear. CT CERVICAL SPINE FINDINGS No fracture. No spondylolisthesis. There is mild loss disc height most evident at C5-C6. Endplate osteophytes are noted from C4 through the upper thoracic spine. The bones are demineralized. Central spinal canal is relatively well preserved as are the neural foramina. There is straightening of the normal cervical lordosis. Soft tissues are unremarkable. The lung apices show mild centrilobular and paraseptal emphysema. IMPRESSION: HEAD CT: No acute intracranial abnormalities. Advanced atrophy for age and mild chronic microvascular ischemic change. CERVICAL CT:  No fracture or acute finding. Electronically Signed   By: Lajean Manes M.D.   On: 06/07/2015 20:35   Mr Brain Wo Contrast  06/08/2015  CLINICAL DATA:   Initial valuation for ataxia. EXAM: MRI HEAD WITHOUT CONTRAST TECHNIQUE: Multiplanar, multiecho pulse sequences of the brain and surrounding structures were obtained  without intravenous contrast. COMPARISON:  Prior CT from 06/07/2015. FINDINGS: Study is degraded by motion artifact. There is patchy restricted diffusion involving the right basal ganglia, with predominant involvement involving the caudate head and body. Extension into the right corona radiata as well as the deep white matter of the right centrum semi ovale of the right frontal lobe. Small patchy cortical infarcts within the right frontal lobe as well. Additional tiny punctate cortical infarct or inferiorly within the right occipital lobe (series 5, image 17). No associated mass effect or hemorrhage. Major intracranial vascular flow voids are maintained. Left vertebral artery is probably diminutive. Diffuse prominence of the CSF containing spaces compatible with generalized cerebral atrophy. Extensive patchy and confluent T2/FLAIR hyperintensity within the periventricular and deep white matter both cerebral hemispheres most consistent with chronic small vessel ischemic disease. Superimposed remote lacunar infarcts within the bilateral basal ganglia, thalami, and pons. No areas of chronic hemorrhage. No mass lesion, midline shift, or mass effect. Ventricular prominence related to global parenchymal volume loss present without hydrocephalus. No extra-axial fluid collection. Craniocervical junction within normal limits. Pituitary gland grossly unremarkable. No acute abnormality about the orbits. Sequela prior lens extraction noted bilaterally. Scattered mucosal thickening within the ethmoidal air cells. No air-fluid levels to suggest active sinus infection. Minimal scattered opacity within the bilateral mastoid air cells. Inner ear structures grossly normal. Bone marrow signal intensity within normal limits. No acute scalp soft tissue abnormality. Probable  sebaceous cyst measuring approximate 3 cm within the right face. IMPRESSION: 1. Patchy acute right MCA territory infarcts involving the right basal ganglia and right frontal lobe as above. No associated hemorrhage or significant mass effect. 2. Advanced cerebral atrophy with moderate chronic small vessel ischemic disease with multiple superimposed remote lacunar infarcts as above. Electronically Signed   By: Jeannine Boga M.D.   On: 06/08/2015 01:48   Mr Jodene Nam Head/brain Wo Cm  06/08/2015  CLINICAL DATA:  History of hypertension and ETOH abuse, with BILATERAL lower extremity weakness and inability to walk. EXAM: MRA HEAD WITHOUT CONTRAST TECHNIQUE: Angiographic images of the Circle of Willis were obtained using MRA technique without intravenous contrast. COMPARISON:  MRI brain 06/08/2015 earlier today. FINDINGS: The internal carotid arteries are dolichoectatic but show no flow reducing lesions throughout their cavernous, petrous, cervical, or supraclinoid segments. Basilar artery similarly is widely patent with RIGHT vertebral dominant. Severely diseased distal LEFT vertebral which predominantly contributes to the LEFT PICA. Moderate to severely diseased RIGHT M1 MCA, with potentially tandem flow reducing lesions in its mid and distal segments estimated to be 50-75%. No MCA branch occlusion. No significant disease LEFT M1 MCA with early anterior temporal branch. No ACA disease of significance. No focal PCA narrowing. No cerebellar branch occlusion. No intracranial aneurysm. 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 Electronically Signed   By: Staci Righter M.D.   On: 06/08/2015 17:20    Assessment: 59 y.o. 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  Stroke Risk Factors -  hypertension  Plan: 1. HgbA1c, fasting lipid panel 2. PT consult, OT consult 3. Echocardiogram 4. Carotid dopplers 5. Prophylactic therapy-Antiplatelet med: Aspirin  6. Risk factor modification 7. Telemetry monitoring  C.R. Nicole Kindred, MD  06/08/2015, 8:08 PM

## 2015-06-08 NOTE — ED Notes (Signed)
Patient currently at MRI via stretcher.  

## 2015-06-09 ENCOUNTER — Inpatient Hospital Stay (HOSPITAL_BASED_OUTPATIENT_CLINIC_OR_DEPARTMENT_OTHER): Payer: Medicaid Other

## 2015-06-09 ENCOUNTER — Inpatient Hospital Stay (HOSPITAL_COMMUNITY): Payer: Medicaid Other

## 2015-06-09 DIAGNOSIS — R4182 Altered mental status, unspecified: Secondary | ICD-10-CM

## 2015-06-09 DIAGNOSIS — I6350 Cerebral infarction due to unspecified occlusion or stenosis of unspecified cerebral artery: Secondary | ICD-10-CM

## 2015-06-09 LAB — LIPID PANEL
CHOL/HDL RATIO: 3.4 ratio
CHOLESTEROL: 113 mg/dL (ref 0–200)
HDL: 33 mg/dL — ABNORMAL LOW (ref >40)
LDL Cholesterol: 61 mg/dL (ref 0–99)
Triglycerides: 95 mg/dL (ref <150)
VLDL: 19 mg/dL (ref 0–40)

## 2015-06-09 LAB — GLUCOSE, CAPILLARY
GLUCOSE-CAPILLARY: 88 mg/dL (ref 65–99)
GLUCOSE-CAPILLARY: 86 mg/dL (ref 65–99)
GLUCOSE-CAPILLARY: 100 mg/dL — AB (ref 65–99)
Glucose-Capillary: 75 mg/dL (ref 65–99)

## 2015-06-09 LAB — CBC
HEMATOCRIT: 45.2 % (ref 39.0–52.0)
Hemoglobin: 15.6 g/dL (ref 13.0–17.0)
MCH: 31.3 pg (ref 26.0–34.0)
MCHC: 34.5 g/dL (ref 30.0–36.0)
MCV: 90.6 fL (ref 78.0–100.0)
PLATELETS: 320 10*3/uL (ref 150–400)
RBC: 4.99 MIL/uL (ref 4.22–5.81)
RDW: 16.4 % — AB (ref 11.5–15.5)
WBC: 8.1 10*3/uL (ref 4.0–10.5)

## 2015-06-09 LAB — COMPREHENSIVE METABOLIC PANEL
ALBUMIN: 3.3 g/dL — AB (ref 3.5–5.0)
ALK PHOS: 83 U/L (ref 38–126)
ALT: 11 U/L — AB (ref 17–63)
AST: 22 U/L (ref 15–41)
Anion gap: 8 (ref 5–15)
BILIRUBIN TOTAL: 0.2 mg/dL — AB (ref 0.3–1.2)
BUN: 9 mg/dL (ref 6–20)
CALCIUM: 8.8 mg/dL — AB (ref 8.9–10.3)
CO2: 22 mmol/L (ref 22–32)
CREATININE: 1.11 mg/dL (ref 0.61–1.24)
Chloride: 103 mmol/L (ref 101–111)
GFR calc Af Amer: >60 mL/min (ref >60)
GLUCOSE: 104 mg/dL — AB (ref 65–99)
POTASSIUM: 4 mmol/L (ref 3.5–5.1)
Sodium: 133 mmol/L — ABNORMAL LOW (ref 135–145)
TOTAL PROTEIN: 7 g/dL (ref 6.5–8.1)

## 2015-06-09 LAB — COPPER, SERUM: COPPER: 137 ug/dL (ref 72–166)

## 2015-06-09 NOTE — Progress Notes (Signed)
[facility] Progress Note  Patient: Jose Neal ZOX:[id number]   PCP: [facility] DOB: [birth year]   DOA: 06/07/2015   DOS: 06/09/2015   Date of Service: the patient was seen and examined on 06/09/2015  Subjective: Patient does not have any acute complaints of dizziness or lightheadedness no seizure episodes overnight. No focal weakness Nutrition: Tolerating oral diet, passed swallowing evaluation Activity: Bedrest at present Last BM: Prior to arrival  Assessment and Plan: 1. Ataxia  Acute MCA CVA MCA atherosclerotic changes Patient presented with ataxia. MRI is positive for right MCA infarct with 50-75% stenosis of the MCA. Neurology is following up with the patient and will follow the recommendation. Complete stroke workup. PT OT speech evaluation. Continue aspirin 325  2. History of alcohol abuse. No evidence of withdrawals. Continue CIWA protocol.  3. Essential hypertension. Continuing blood pressure medications at present.  DVT Prophylaxis: subcutaneous Heparin Nutrition: Cardiac diet  Advance goals of care discussion: full code  Brief Summary of Hospitalization:  Procedures: echo Consultants: neurology Antibiotics: Anti-infectives    None      Family Communication: no family was present at bedside, at the time of interview.   Disposition:  Expected discharge date: 06/10/2015  Barriers to safe discharge: Stroke workup    Intake/Output Summary (Last 24 hours) at 06/09/15 1500 Last data filed at 06/08/15 2100  Gross per 24 hour  Intake    240 ml  Output    350 ml  Net   -110 ml   Filed Weights   06/07/15 1820  Weight: 86.183 kg (190 lb)    Objective: Physical Exam: Filed Vitals:   06/09/15 0204 06/09/15 0644 06/09/15 0946 06/09/15 1015  BP: 173/96 181/109 167/81 148/50  Pulse: 60 48 64 68  Temp: 97.5 F (36.4 C) 97.5 F (36.4 C) 98.2 F (36.8 C)   TempSrc: Axillary Axillary Oral   Resp: Height:        Weight:      SpO2: 100% 100% 100% 98%     General: Appear in mild distress, no Rash; Oral Mucosa moist. Cardiovascular: S1 and S2 Present, no Murmur, no JVD Respiratory: Bilateral Air entry present and Clear to Auscultation, no Crackles, no wheezes Abdomen: Bowel Sound present, Soft and no tenderness Extremities: no Pedal edema, no calf tenderness Neurology: right sided weakness  Data Reviewed: CBC:  Recent Labs Lab 06/07/15 2021 06/07/15 2031 06/08/15 0314 06/09/15 0640  WBC  --  7.9 7.6  7.0 8.1  NEUTROABS  --  4.8  --   --   HGB 19.4* 14.1 14.6  13.6 15.6  HCT 57.0* 41.8 42.2  41.1 45.2  MCV  --  90.3 91.3  91.7 90.6  PLT  --  332 314  306 320   Basic Metabolic Panel:  Recent Labs Lab 06/07/15 2021 06/07/15 2031 06/08/15 0314 06/08/15 1705  NA 140 137 138  --   K 3.6 3.9 4.0  --   CL 103 103 102  --   CO2  --  22 23  --   GLUCOSE 67 73 81  --   BUN --   CREATININE 1.00 1.02 0.88  0.93  --   CALCIUM  --  9.1 9.1  --   MG  --   --   --  2.3   Liver Function Tests:  Recent Labs Lab 06/07/15 2031  AST 19  ALT 13*  ALKPHOS 87  BILITOT  0.7  PROT 7.4  ALBUMIN 3.7   No results for input(s): LIPASE, AMYLASE in the last 168 hours. No results for input(s): AMMONIA in the last 168 hours.  Cardiac Enzymes:  Recent Labs Lab 06/07/15 2031 06/08/15 1705 06/08/15 2121  CKTOTAL 130  --   --   TROPONINI  --  <0.03 <0.03    BNP (last 3 results) No results for input(s): BNP in the last 8760 hours.  CBG:  Recent Labs Lab 06/08/15 0448 06/08/15 1901 06/08/15 2222 06/09/15 0653 06/09/15 1133  GLUCAP 81 87 78 75 88    No results found for this or any previous visit (from the past 240 hour(s)).   Studies: Mr Maxine Glenn Head/brain Wo Cm  06/08/2015  CLINICAL DATA:  History of hypertension and ETOH abuse, with BILATERAL lower extremity weakness and inability to walk. EXAM: MRA HEAD WITHOUT CONTRAST TECHNIQUE: Angiographic images of the  Circle of Willis were obtained using MRA technique without intravenous contrast. COMPARISON:  MRI brain 06/08/2015 earlier today. FINDINGS: The internal carotid arteries are dolichoectatic but show no flow reducing lesions throughout their cavernous, petrous, cervical, or supraclinoid segments. Basilar artery similarly is widely patent with RIGHT vertebral dominant. Severely diseased distal LEFT vertebral which predominantly contributes to the LEFT PICA. Moderate to severely diseased RIGHT M1 MCA, with potentially tandem flow reducing lesions in its mid and distal segments estimated to be 50-75%. No MCA branch occlusion. No significant disease LEFT M1 MCA with early anterior temporal branch. No ACA disease of significance. No focal PCA narrowing. No cerebellar branch occlusion. No intracranial aneurysm. 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 Electronically Signed   By: Elsie Stain M.D.   On: 06/08/2015 17:20     Scheduled Meds: .  stroke: mapping our early stages of recovery book   Does not apply Once  . aspirin  325 mg Oral Daily  . diltiazem  180 mg Oral Daily  . enoxaparin (LOVENOX) injection  40 mg Subcutaneous Q24H  . folic acid  1 mg Oral Daily  . multivitamin with minerals  1 tablet Oral Daily  . pantoprazole  40 mg Oral BID  . thiamine  100 mg Oral Daily   Continuous Infusions: . 0.45 % NaCl with KCl 20 mEq / L 125 mL/hr at 06/08/15 0235   PRN Meds: acetaminophen **OR** acetaminophen, alum & mag hydroxide-simeth, hydrALAZINE, LORazepam, ondansetron **OR** ondansetron (ZOFRAN) IV, polyethylene glycol, senna-docusate  Time spent: 30 minutes  Author: Lynden Oxford, MD [facility] Pager: [phone] 06/09/2015 3:00 PM  If 7PM-7AM, please contact night-coverage at [email or web address], password [facility]

## 2015-06-09 NOTE — Progress Notes (Signed)
STROKE TEAM PROGRESS NOTE   HISTORY OF PRESENT ILLNESS Jose W HCAETANO OBERHAUS60 y.o. male history of hypertension and alcohol abuse brought to the hospital for evaluation of new onset lower extremity weakness and unstable gait. Patient reportedly stopped drinking about 24 hours prior to presentation. Alcohol level in the ED was 43. CT scan of his head was unremarkable. 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 MRA showed intracranial atherosclerotic changes affecting right M1 segment primarily. Tandem lesions with estimated 50-75% stenosis were noted.  LSN: 06/06/2015 at 10:30 AM tPA Given: No: Beyond time under for treatment consideration mRankin:   SUBJECTIVE (INTERVAL HISTORY) His family was not at the bedside.  Overall he feels his condition is the same.  He denied pertinent positives for ROS.   OBJECTIVE Temp:  [97.5 F (36.4 C)-98.8 F (37.1 C)] 97.5 F (36.4 C) (02/25 0644) Pulse Rate:  [46-64] 48 (02/25 0644) Cardiac Rhythm:  [-] Normal sinus rhythm (02/24 2015) Resp:  [14-18] 16 (02/25 0644) BP: (164-195)/(80-109) 181/109 mmHg (02/25 0644) SpO2:  [93 %-100 %] 100 % (02/25 0644)  CBC:  Recent Labs Lab 06/07/15 2031 06/08/15 0314 06/09/15 0640  WBC 7.9 7.6  7.0 8.1  NEUTROABS 4.8  --   --   HGB 14.1 14.6  13.6 15.6  HCT 41.8 42.2  41.1 45.2  MCV 90.3 91.3  91.7 90.6  PLT 332 314  306 320    Basic Metabolic Panel:  Recent Labs Lab 06/07/15 2031 06/08/15 0314 06/08/15 1705  NA 137 138  --   K 3.9 4.0  --   CL 103 102  --   CO2 22 23  --   GLUCOSE 73 81  --   BUN 11 10  --   CREATININE 1.02 0.88  0.93  --   CALCIUM 9.1 9.1  --   MG  --   --  2.3    Lipid Panel: No results found for: CHOL, TRIG, HDL, CHOLHDL, VLDL, LDLCALC HgbA1c: No results found for: HGBA1C Urine Drug Screen:    Component Value Date/Time   LABOPIA NONE DETECTED 06/02/2012 2214    COCAINSCRNUR NONE DETECTED 06/02/2012 2214   LABBENZ NONE DETECTED 06/02/2012 2214   AMPHETMU NONE DETECTED 06/02/2012 2214   THCU NONE DETECTED 06/02/2012 2214   LABBARB NONE DETECTED 06/02/2012 2214      IMAGING  Dg Chest 2 View 06/07/2015   No edema or consolidation. \  Dg Lumbar Spine Complete 06/07/2015   1. No fracture or acute finding.  No bone lesion.  2. Disc degenerative change at L5-S1.    Ct Head Wo Contrast 06/07/2015   No acute intracranial abnormalities. Advanced atrophy for age and mild chronic microvascular ischemic change.    Ct Cervical Spine Wo Contrast 06/07/2015   No fracture or acute finding.    Mr Brain Wo Contrast 06/08/2015   1. Patchy acute right MCA territory infarcts involving the right basal ganglia and right frontal lobe as above. No associated hemorrhage or significant mass effect.  2. Advanced cerebral atrophy with moderate chronic small vessel ischemic disease with multiple superimposed remote lacunar infarcts as above.     Mr Maxine Glenn Head/brain Wo Cm 06/08/2015   Intracranial atherosclerotic change affecting the RIGHT M1 MCA primarily. 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     PHYSICAL EXAM HEENT- Normocephalic, no lesions, without obvious abnormality. Normal external eye and conjunctiva. Normal external nose,  mucus membranes and septum. Normal pharynx. Neck supple with no masses, nodes, nodules or enlargement. Cardiovascular - regular rate and rhythm, S1, S2 normal, no murmur, click, rub or gallop Lungs - chest clear, no wheezing, rales, normal symmetric air entry Abdomen - soft, non-tender; bowel sounds normal; no masses, no organomegaly Extremities - no joint deformities, effusion, or inflammation and no edema  Neurologic Examination: Mental Status: Alert, disoriented to current month, no acute distress. Speech fluent without evidence of aphasia. Able to follow commands without difficulty.  Lethargic but  cooperative  Cranial Nerves: II-Visual fields were normal. III/IV/VI-Pupils were equal and reacted normally to light. Extraocular movements were full and conjugate.  V/VII-no facial numbness and no facial weakness. VIII-normal. X-normal speech and symmetrical palatal movement. XI: trapezius strength/neck flexion strength normal bilaterally XII-midline tongue extension with normal strength. Motor: Mild drift of left upper and lower extremities with mild proximal weakness; motor exam otherwise unremarkable Sensory: Normal throughout. Deep Tendon Reflexes: 1+ and symmetric. Plantars: Flexor on the right and mute on the left area Cerebellar: Minimally impaired finger to nose testing bilaterally.   ASSESSMENT/PLAN Jose Neal is a 60 y.o. male with history of hypertension, tobacco use, and alcohol abuse presenting with acute onset of gait disorder. He did not receive IV t-PA due to late presentation.  Strokes:  Non-dominant infarcts probably embolic from an unknown source.  Resultant  Left sided weakness  MRI - Patchy acute right MCA territory infarcts involving the right basal ganglia and right frontal lobe.  MRA  - Multiple tandem lesions estimated 50- 75% stenosis of the right M1 MCA primarily.  Carotid Doppler - Bilateral: 1-39% ICA stenosis. Vertebral artery flow is antegrade.   2D Echo pending  LDL pending  HgbA1c pending  VTE prophylaxis - Lovenox  Diet Heart Room service appropriate?: Yes; Fluid consistency:: Thin  No antithrombotic prior to admission, now on aspirin 325 mg daily  Patient counseled to be compliant with his antithrombotic medications  Ongoing aggressive stroke risk factor management  Therapy recommendations: Home health physical therapy recommended  Disposition: Pending  Hypertension  Blood pressure mildly high  Permissive hypertension (OK if < 220/120) but gradually normalize in 5-7 days  Hyperlipidemia  Home meds:  No lipid  lowering medications prior to admission.  LDL pending, goal < 70  Continue statin at discharge  Other Stroke Risk Factors  Advanced age  Cigarette smoker, advised to stop smoking  ETOH use  Hx stroke/TIA - old stroke by MRI    Other Active Problems  Multiple tandem lesions estimated 50- 75% stenosis of the right M1 MCA primarily.  Hospital day # 1  Delton See [facility] Pager [phone] 06/09/2015, 3:52 PM  ATTENDING NOTE: Patient was seen and examined by me personally. Documentation reflects findings. The laboratory and radiographic studies reviewed by me. ROS completed by me personally and pertinent positives fully documented  Condition: Stable  Assessment and plan completed by me personally and fully documented above. Plans/Recommendations include:     Stroke work=up ongoing  Will follow  SIGNED BY: Dr. Sula Soda     To contact Stroke Continuity provider, please refer to [email or web address]. After hours, contact General Neurology

## 2015-06-09 NOTE — Progress Notes (Signed)
Pt back on floor. Hourly rounding performed. Call light within reach. Pt in no acute distress. Denies needs. Bed alarm on.

## 2015-06-09 NOTE — Evaluation (Signed)
Physical Therapy Evaluation Patient Details Name: Jose Neal MRN: [id number] DOB: 1955/07/12 Today's Date: 06/09/2015   History of Present Illness  60 y.o. male with hx of HTN and ETOH abuse who presents to ED brought by his daughters for bilat LE weakness and inability to walk.Pt reports drinking a "six-pack" of beer a day, his daughter says he drinks "a lot more than that".MRI brian showed Patchy acute right MCA territory infarcts involving the right basal ganglia and right frontal lobe,  Clinical Impression  Pt admitted with above diagnosis. Pt currently with functional limitations due to the deficits listed below (see PT Problem List).  Pt will benefit from skilled PT to increase their independence and safety with mobility to allow discharge to the venue listed below.  Pt was eating lunch, but agreeable to quick PT evaluation.  PT unsteady and did best with 2 person HHA with decreased attention to L side.  Recommend RW trial next session.  Will need 24/7 S and A with mobility at home which he states his daughter and sisters can provide. Recommend HHPT and probably a RW.     Follow Up Recommendations Home health PT;Supervision for mobility/OOB;Supervision/Assistance - 24 hour    Equipment Recommendations  Rolling walker with 5" wheels    Recommendations for Other Services       Precautions / Restrictions Precautions Precautions: Fall Restrictions Weight Bearing Restrictions: No      Mobility  Bed Mobility               General bed mobility comments: sitting EOB upon arrival  Transfers Overall transfer level: Needs assistance   Transfers: Sit to/from Stand Sit to Stand: Min assist;+2 physical assistance            Ambulation/Gait Ambulation/Gait assistance: Min assist;+2 physical assistance   Assistive device: 2 person hand held assist Gait Pattern/deviations: Decreased stride length;Decreased step length - left;Drifts right/left;Wide base of  support Gait velocity: decreased   General Gait Details: Initially ambulated with HHA of 2 with wide BOS.  Eventually able to transition to 1 person HHA, but unsteady.  tends to drift L and didn't have awareness of obstacles on L side.   Stairs            Wheelchair Mobility    Modified Rankin (Stroke Patients Only) Modified Rankin (Stroke Patients Only) Pre-Morbid Rankin Score: Moderate disability Modified Rankin: Moderately severe disability     Balance Overall balance assessment: Needs assistance   Sitting balance-Leahy Scale: Fair       Standing balance-Leahy Scale: Poor Standing balance comment: wide BOS with flexed knees. cues for posture- needs UE support                             Pertinent Vitals/Pain Pain Assessment: No/denies pain    Home Living Family/patient expects to be discharged to:: Private residence Living Arrangements: Children Available Help at Discharge: Available 24 hours/day;Family (reports daughter and sisters can help) Type of Home: Apartment Home Access: Level entry     Home Layout: One level Home Equipment: Other (comment) Additional Comments: "a big stick"    Prior Function Level of Independence: Independent with assistive device(s)         Comments: furniture cruised in apartment and walked with a big stick outside     Hand Dominance   Dominant Hand: Right    Extremity/Trunk Assessment   Upper Extremity Assessment: Defer to OT evaluation  Lower Extremity Assessment: Overall WFL for tasks assessed;LLE deficits/detail   LLE Deficits / Details: cues to complete full ROM with df, but able grossly 3 to 3+/5 on L     Communication   Communication: No difficulties  Cognition Arousal/Alertness: Awake/alert   Overall Cognitive Status: No family/caregiver present to determine baseline cognitive functioning Area of Impairment: Safety/judgement;Problem solving     Memory: Decreased short-term  memory       Problem Solving: Slow processing;Decreased initiation      General Comments General comments (skin integrity, edema, etc.): attempted to test visual field, but pt unable to fully follow directions for accurate test.    Exercises        Assessment/Plan    PT Assessment Patient needs continued PT services  PT Diagnosis Difficulty walking   PT Problem List Decreased balance;Decreased mobility;Decreased strength;Decreased cognition;Decreased knowledge of use of DME  PT Treatment Interventions DME instruction;Gait training;Functional mobility training;Therapeutic activities;Therapeutic exercise;Balance training;Neuromuscular re-education;Patient/family education   PT Goals (Current goals can be found in the Care Plan section) Acute Rehab PT Goals Patient Stated Goal: go home PT Goal Formulation: With patient Time For Goal Achievement: 06/16/15 Potential to Achieve Goals: Good    Frequency Min 4X/week   Barriers to discharge        Co-evaluation               End of Session Equipment Utilized During Treatment: Gait belt Activity Tolerance: Patient tolerated treatment well Patient left: in chair;with call bell/phone within reach;with chair alarm set;Other (comment) (with lunch) Nurse Communication: Mobility status         Time: 5366-4403 PT Time Calculation (min) (ACUTE ONLY): 17 min   Charges:   PT Evaluation $PT Eval Moderate Complexity: 1 Procedure     PT G Codes:        Stefan Antha Niday LUBECK 06/09/2015, 1:46 PM

## 2015-06-09 NOTE — Progress Notes (Signed)
Pt off the floor to vascular.

## 2015-06-09 NOTE — Progress Notes (Signed)
VASCULAR LAB PRELIMINARY  PRELIMINARY  PRELIMINARY  PRELIMINARY  Carotid duplex completed.    Preliminary report:  Bilateral:  1-39% ICA stenosis.  Vertebral artery flow is antegrade.     Lakresha Stifter, RVS 06/09/2015, 12:21 PM

## 2015-06-09 NOTE — Progress Notes (Signed)
PT Cancellation Note  Patient Details Name: ARDON FRANKLIN MRN: [id number] DOB: 1955-08-18   Cancelled Treatment:    Reason Eval/Treat Not Completed: Patient at procedure or test/unavailable. Pt off of the unit. Will check back as schedule permits.   Leovardo Thoman LUBECK 06/09/2015, 12:27 PM

## 2015-06-10 LAB — BASIC METABOLIC PANEL
ANION GAP: 8 (ref 5–15)
BUN: 7 mg/dL (ref 6–20)
CALCIUM: 8.9 mg/dL (ref 8.9–10.3)
CO2: 22 mmol/L (ref 22–32)
Chloride: 105 mmol/L (ref 101–111)
Creatinine, Ser: 1 mg/dL (ref 0.61–1.24)
Glucose, Bld: 133 mg/dL — ABNORMAL HIGH (ref 65–99)
POTASSIUM: 3.8 mmol/L (ref 3.5–5.1)
SODIUM: 135 mmol/L (ref 135–145)

## 2015-06-10 LAB — CBC
HEMATOCRIT: 41.4 % (ref 39.0–52.0)
Hemoglobin: 14 g/dL (ref 13.0–17.0)
MCH: 30.3 pg (ref 26.0–34.0)
MCHC: 33.8 g/dL (ref 30.0–36.0)
MCV: 89.6 fL (ref 78.0–100.0)
PLATELETS: 346 10*3/uL (ref 150–400)
RBC: 4.62 MIL/uL (ref 4.22–5.81)
RDW: 16 % — AB (ref 11.5–15.5)
WBC: 8.9 10*3/uL (ref 4.0–10.5)

## 2015-06-10 LAB — GLUCOSE, CAPILLARY
GLUCOSE-CAPILLARY: 95 mg/dL (ref 65–99)
Glucose-Capillary: 83 mg/dL (ref 65–99)
Glucose-Capillary: 92 mg/dL (ref 65–99)

## 2015-06-10 LAB — MAGNESIUM: MAGNESIUM: 2.2 mg/dL (ref 1.7–2.4)

## 2015-06-10 MED ORDER — HYDROCHLOROTHIAZIDE 25 MG PO TABS
25.0000 mg | ORAL_TABLET | Freq: Every day | ORAL | Status: DC
  Administered 2015-06-10: 25 mg via ORAL
  Filled 2015-06-10: qty 1

## 2015-06-10 MED ORDER — HYDROCHLOROTHIAZIDE 25 MG PO TABS: 25.0000 mg | ORAL_TABLET | Freq: Every day | ORAL | Status: DC

## 2015-06-10 MED ORDER — THIAMINE HCL 100 MG PO TABS: 100.0000 mg | ORAL_TABLET | Freq: Every day | ORAL | Status: AC

## 2015-06-10 MED ORDER — CLOPIDOGREL BISULFATE 75 MG PO TABS: 75.0000 mg | ORAL_TABLET | Freq: Every day | ORAL | Status: AC

## 2015-06-10 MED ORDER — ATORVASTATIN CALCIUM 40 MG PO TABS
40.0000 mg | ORAL_TABLET | Freq: Every day | ORAL | Status: DC
  Administered 2015-06-10: 40 mg via ORAL
  Filled 2015-06-10: qty 1

## 2015-06-10 MED ORDER — POLYETHYLENE GLYCOL 3350 17 G PO PACK: 17.0000 g | PACK | Freq: Every day | ORAL | Status: DC | PRN

## 2015-06-10 MED ORDER — FOLIC ACID 1 MG PO TABS: 1.0000 mg | ORAL_TABLET | Freq: Every day | ORAL | Status: AC

## 2015-06-10 MED ORDER — PANTOPRAZOLE SODIUM 40 MG PO TBEC: 40.0000 mg | DELAYED_RELEASE_TABLET | Freq: Two times a day (BID) | ORAL | Status: DC

## 2015-06-10 MED ORDER — CLOPIDOGREL BISULFATE 75 MG PO TABS
75.0000 mg | ORAL_TABLET | Freq: Every day | ORAL | Status: DC
  Administered 2015-06-10: 75 mg via ORAL
  Filled 2015-06-10: qty 1

## 2015-06-10 MED ORDER — ATORVASTATIN CALCIUM 40 MG PO TABS: 40.0000 mg | ORAL_TABLET | Freq: Every day | ORAL | Status: DC

## 2015-06-10 MED ORDER — ASPIRIN 325 MG PO TABS: 325.0000 mg | ORAL_TABLET | Freq: Every day | ORAL | Status: AC

## 2015-06-10 MED ORDER — AMLODIPINE BESYLATE 5 MG PO TABS
5.0000 mg | ORAL_TABLET | Freq: Every day | ORAL | Status: DC
  Administered 2015-06-10: 5 mg via ORAL
  Filled 2015-06-10: qty 1

## 2015-06-10 MED ORDER — AMLODIPINE BESYLATE 5 MG PO TABS
5.0000 mg | ORAL_TABLET | Freq: Every day | ORAL | Status: DC
Start: 2015-06-10 — End: 2015-09-22

## 2015-06-10 NOTE — Discharge Instructions (Signed)
Stroke Prevention Some health problems and behaviors may make it more likely for you to have a stroke. Below are ways to lessen your risk of having a stroke.   Be active for at least 30 minutes on most or all days.  Do not smoke. Try not to be around others who smoke.  Do not drink too much alcohol.  Do not have more than 2 drinks a day if you are a man.  Do not have more than 1 drink a day if you are a woman and are not pregnant.  Eat healthy foods, such as fruits and vegetables. If you were put on a specific diet, follow the diet as told.  Keep your cholesterol levels under control through diet and medicines. Look for foods that are low in saturated fat, trans fat, cholesterol, and are high in fiber.  If you have diabetes, follow all diet plans and take your medicine as told.  Ask your doctor if you need treatment to lower your blood pressure. If you have high blood pressure (hypertension), follow all diet plans and take your medicine as told by your doctor.  If you are 20-87 years old, have your blood pressure checked every 3-5 years. If you are age 71 or older, have your blood pressure checked every year.  Keep a healthy weight. Eat foods that are low in calories, salt, saturated fat, trans fat, and cholesterol.  Do not take drugs.  Avoid birth control pills, if this applies. Talk to your doctor about the risks of taking birth control pills.  Talk to your doctor if you have sleep problems (sleep apnea).  Take all medicine as told by your doctor.  You may be told to take aspirin or blood thinner medicine. Take this medicine as told by your doctor.  Understand your medicine instructions.  Make sure any other conditions you have are being taken care of. GET HELP RIGHT AWAY IF:  You suddenly lose feeling (you feel numb) or have weakness in your face, arm, or leg.  Your face or eyelid hangs down to one side.  You suddenly feel confused.  You have trouble talking (aphasia)  or understanding what people are saying.  You suddenly have trouble seeing in one or both eyes.  You suddenly have trouble walking.  You are dizzy.  You lose your balance or your movements are clumsy (uncoordinated).  You suddenly have a very bad headache and you do not know the cause.  You have new chest pain.  Your heart feels like it is fluttering or skipping a beat (irregular heartbeat). Do not wait to see if the symptoms above go away. Get help right away. Call your local emergency services (911 in [place] Do not drive yourself to the hospital.   This information is not intended to replace advice given to you by your health care provider. Make sure you discuss any questions you have with your health care provider.   Document Released: 09/30/2011 Document Revised: 04/21/2014 Document Reviewed: 10/01/2012 Elsevier Interactive Patient Education 2016 [employer] STROKE/TIA DISCHARGE INSTRUCTIONS SMOKING Cigarette smoking nearly doubles your risk of having a stroke & is the single most alterable risk factor  If you smoke or have smoked in the last 12 months, you are advised to quit smoking for your health.  Most of the excess cardiovascular risk related to smoking disappears within a year of stopping.  Ask you doctor about anti-smoking medications  [place] Quit Line: 1-800-QUIT NOW  Free Smoking Cessation Classes [phone]  [phone]  CHOLESTEROL Know your levels; limit fat & cholesterol in your diet  Lipid Panel     Component Value Date/Time   CHOL 113 06/09/2015 1543   TRIG 95 06/09/2015 1543   HDL 33* 06/09/2015 1543   CHOLHDL 3.4 06/09/2015 1543   VLDL 19 06/09/2015 1543   LDLCALC 61 06/09/2015 1543      Many patients benefit from treatment even if their cholesterol is at goal.  Goal: Total Cholesterol (CHOL) less than 160  Goal:  Triglycerides (TRIG) less than 150  Goal:  HDL greater than 40  Goal:  LDL (LDLCALC) less than 100   BLOOD PRESSURE American Stroke  Association blood pressure target is less that 120/80 mm/Hg  Your discharge blood pressure is:  BP: (!) 153/78 mmHg  Monitor your blood pressure  Limit your salt and alcohol intake  Many individuals will require more than one medication for high blood pressure  DIABETES (A1c is a blood sugar average for last 3 months) Goal HGBA1c is under 7% (HBGA1c is blood sugar average for last 3 months)  Diabetes: No known diagnosis of diabetes    No results found for: HGBA1C   Your HGBA1c can be lowered with medications, healthy diet, and exercise.  Check your blood sugar as directed by your physician  Call your physician if you experience unexplained or low blood sugars.  PHYSICAL ACTIVITY/REHABILITATION Goal is 30 minutes at least 4 days per week  Activity: No driving, and Walk with assistance, Therapies: Physical Therapy: Home Health and Occupational Therapy: Home Health Return to work: n/a  Activity decreases your risk of heart attack and stroke and makes your heart stronger.  It helps control your weight and blood pressure; helps you relax and can improve your mood.  Participate in a regular exercise program.  Talk with your doctor about the best form of exercise for you (dancing, walking, swimming, cycling).  DIET/WEIGHT Goal is to maintain a healthy weight  Your discharge diet is: Diet Heart Room service appropriate?: Yes; Fluid consistency:: Thin Diet - low sodium heart healthy  liquids Your height is:  Height:  (185.4 cm) Your current weight is: Weight: 86.183 kg (190 lb) Your Body Mass Index (BMI) is:  BMI (Calculated): 25.1  Following the type of diet specifically designed for you will help prevent another stroke.  Your goal weight range is:  140-189lbs  Your goal Body Mass Index (BMI) is 19-24.  Healthy food habits can help reduce 3 risk factors for stroke:  High cholesterol, hypertension, and excess weight.  RESOURCES Stroke/Support Group:  Call [phone]   STROKE  EDUCATION PROVIDED/REVIEWED AND GIVEN TO PATIENT Stroke warning signs and symptoms How to activate emergency medical system (call 911). Medications prescribed at discharge. Need for follow-up after discharge. Personal risk factors for stroke. Pneumonia vaccine given: No Flu vaccine given: No My questions have been answered, the writing is legible, and I understand these instructions.  I will adhere to these goals & educational materials that have been provided to me after my discharge from the hospital.

## 2015-06-10 NOTE — Evaluation (Signed)
Occupational Therapy Evaluation Patient Details Name: Jose Neal MRN: [id number] DOB: May 15, 1955 Today's Date: 06/10/2015    History of Present Illness 60 y.o. 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 Currently pt is overall mod assist for ADLs and functional mobility. Pt noted to have good ROM of LUE but decreased strength overall and decreased fine motor coordination. No visual field deficits noted. Educated pt and family on home safety, 24/7 supervision upon return home, L hand fine motor coordination and strengthening exercises with yellow theraputty, incorporating LUE into functional activities. Pt planning to d/c home with 24/7 supervision from family. Recommending outpatient neuro OT for follow up to maximize independence and safety with ADLs and functional mobility. Pt would benefit from continued skilled OT to address established goals.     Follow Up Recommendations  Outpatient OT;Supervision/Assistance - 24 hour (neuro)    Equipment Recommendations  None recommended by OT    Recommendations for Other Services       Precautions / Restrictions Precautions Precautions: Fall Restrictions Weight Bearing Restrictions: No      Mobility Bed Mobility Overal bed mobility: Needs Assistance Bed Mobility: Rolling;Sidelying to Sit Rolling: Min guard Sidelying to sit: Min assist       General bed mobility comments: Pt OOB in chair upon arrival.  Transfers Overall transfer level: Needs assistance Equipment used: Rolling walker (2 wheeled) Transfers: Sit to/from Stand Sit to Stand: Min assist          General transfer comment: Min assist for sit to stand from chair. VCs for hand placement and technique.    Balance Overall balance assessment: Needs assistance Sitting-balance support: Feet supported;No upper extremity supported Sitting balance-Leahy Scale: Fair     Standing balance support: No upper extremity supported;During functional activity Standing balance-Leahy Scale: Poor Standing balance comment: L lateral lean with grooming activity at sink.                            ADL Overall ADL's : Needs assistance/impaired Eating/Feeding: Set up;Sitting   Grooming: Moderate assistance;Oral care;Standing;Cueing for safety;Cueing for sequencing Grooming Details (indicate cue type and reason): Pt noted to have L lateral lean in standing able to correct with verbal and tactile cues but difficulty maintaining upright posture. Assist for opening toothpaste; pt unable to grip objects in L hand. Upper Body Bathing: Moderate assistance;Sitting;Cueing for sequencing;Cueing for safety   Lower Body Bathing: Moderate assistance;Sit to/from stand;Cueing for sequencing;Cueing for safety   Upper Body Dressing : Moderate assistance;Sitting;Cueing for safety;Cueing for sequencing Upper Body Dressing Details (indicate cue type and reason): Educated on L arm first into clothing. Lower Body Dressing: Moderate assistance;Cueing for safety;Cueing for sequencing;Sit to/from stand   Toilet Transfer: Moderate assistance;Ambulation;Regular Toilet;Cueing for safety;Cueing for sequencing Toilet Transfer Details (indicate cue type and reason): Simulated         Functional mobility during ADLs: Moderate assistance;Cueing for safety;Cueing for sequencing General ADL Comments: Pts daughter and son present for OT eval. Educated on no tub transfers until cleared by outpatient therapy, sitting for sponge bathing for safety, home safety.  Educated on incorporating L UE into functional activities,  performing self ROM, and completing fine motor coordination exercises daily. Educated pt on fine motor coordination exercises with use of theraputty; pt able to return demo and daughter verbalized understanding.     Vision Vision Assessment?: No apparent visual deficits   Perception     Praxis      Pertinent Vitals/Pain Pain Assessment: No/denies pain     Hand Dominance Right   Extremity/Trunk Assessment Upper Extremity Assessment Upper Extremity Assessment: LUE deficits/detail LUE Deficits / Details: ROM overall WFL. Decreased strength overall: shoulder flexion 4/5, elbow 4/5. Decreased grip strength and fine motor coordination. LUE Sensation: decreased light touch (on hand) LUE Coordination: decreased fine motor;decreased gross motor   Lower Extremity Assessment Lower Extremity Assessment: Defer to PT evaluation   Cervical / Trunk Assessment Cervical / Trunk Assessment: Normal   Communication Communication Communication: No difficulties   Cognition Arousal/Alertness: Awake/alert (periods of sleepiness, easily arouses ) Behavior During Therapy: WFL for tasks assessed/performed Overall Cognitive Status: Impaired/Different from baseline Area of Impairment: Safety/judgement;Problem solving;Awareness;Memory     Memory: Decreased short-term memory Following Commands: Follows one step commands consistently;Follows multi-step commands inconsistently Safety/Judgement: Decreased awareness of safety;Decreased awareness of deficits   Problem Solving: Slow processing;Decreased initiation;Difficulty sequencing;Requires verbal cues     General Comments       Exercises Exercises: Other exercises Other Exercises Other Exercises: Educated pt on fine motor coordination exercises with yellow theraputty; pt able to return demo understanding with daughter providing verbal cues for exercises.   Shoulder Instructions      Home Living Family/patient expects to be discharged to::  Private residence Living Arrangements: Children Available Help at Discharge: Available 24 hours/day;Family                         Home Equipment: Shower seat   Additional Comments: Daughter reports pt is going to stay with her upon d/c from hospital. She currently is looking for a new apartment so she is unsure of what their home setup will be.       Prior Functioning/Environment Level of Independence: Independent with assistive device(s)        Comments: furniture cruised in apartment and walked with a big stick outside    OT Diagnosis: Generalized weakness;Cognitive deficits;Hemiplegia non-dominant side   OT Problem List: Decreased strength;Decreased range of motion;Impaired balance (sitting and/or standing);Decreased coordination;Decreased cognition;Decreased safety awareness;Decreased knowledge of use of DME or AE;Decreased knowledge of precautions;Impaired sensation;Impaired UE functional use   OT Treatment/Interventions: Self-care/ADL training;Therapeutic exercise;Neuromuscular education;Energy conservation;DME and/or AE instruction;Therapeutic activities;Cognitive remediation/compensation;Patient/family education;Balance training    OT Goals(Current goals can be found in the care plan section) Acute Rehab OT Goals Patient Stated Goal: go home OT Goal Formulation: With patient/family Time For Goal Achievement: 06/24/15 Potential to Achieve Goals: Fair ADL Goals Pt Will Perform Grooming: with min guard assist;standing Pt Will Perform Upper Body Bathing: with min guard assist;sitting Pt Will Perform Lower Body Bathing: with min guard assist;sit to/from stand Pt Will Transfer to Toilet: with min guard assist;ambulating;bedside commode (over toilet) Pt Will Perform Toileting - Clothing Manipulation and hygiene: with min guard assist;sit to/from stand Pt/caregiver will Perform Home Exercise Program: Increased strength;Left upper extremity;With theraputty;With  Supervision;With written HEP provided (increase fine motor coordination)  OT Frequency: Min 2X/week   Barriers to D/C:            Co-evaluation  End of Session Equipment Utilized During Treatment: Gait belt  Activity Tolerance: Patient limited by lethargy Patient left: in chair;with call bell/phone within reach;with chair alarm set;with family/visitor present   Time: 1032-1110 OT Time Calculation (min): 38 min Charges:  OT General Charges $OT Visit: 1 Procedure OT Evaluation $OT Eval Moderate Complexity: 1 Procedure OT Treatments $Self Care/Home Management : 8-22 mins $Therapeutic Activity: 8-22 mins G-Codes:     Gaye Alken M.S., OTR/L Pager: [phone]  06/10/2015, 1:12 PM

## 2015-06-10 NOTE — Progress Notes (Signed)
Physical Therapy Treatment Patient Details Name: Jose Neal MRN: [id number] DOB: [birth year] Today's Date: 06/10/2015    History of Present Illness 60 y.o. 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 Discussed with OT decreased left hand grip on walker and OT to address with eval after this session. Pt with daughter present in room today (no family present yesterday). Pt's daughter reports his cognition is close to baseline and feels today was due to him being sleepy from medication. Daughter confirms that the pt will have 24 hour assist at home post discharge. Discussed with primary PT prior to session discharge planning for HHPT vs outpt PT: most appropriate one depending on pt's progress and home assistance due to limited benefits/coverage with Medicaid. After discussion with pt/daughter, it was preferred to skip HHPT and go straight to outpatient for PT post CVA. Due to pt having 24 hours assistance and home this would be an appropriate venue for post acute rehab to address continued gait, balance and strength deficits. Discussed again after session with primary PT and she is in agreement with this change to discharge planning.    Follow Up Recommendations  Outpatient PT;Supervision/Assistance - 24 hour;Supervision for mobility/OOB     Equipment Recommendations  Rolling walker with 5" wheels       Precautions / Restrictions Precautions Precautions: Fall Restrictions Weight Bearing Restrictions: No    Mobility  Bed Mobility Overal bed mobility: Needs Assistance Bed Mobility: Rolling;Sidelying to  Sit Rolling: Min guard Sidelying to sit: Min assist       General bed mobility comments: bed flat with no rails used. cues on technique and sequencing.  Transfers Overall transfer level: Needs assistance Equipment used: Rolling walker (2 wheeled) Transfers: Sit to/from Stand Sit to Stand: Min assist;From elevated surface         General transfer comment: cues for hand placement for safety and for anterior weight shifting to stand up  Ambulation/Gait Ambulation/Gait assistance: Min assist;+2 safety/equipment Ambulation Distance (Feet): 100 Feet Assistive device: Rolling walker (2 wheeled) Gait Pattern/deviations: Step-through pattern;Decreased step length - right;Decreased step length - left;Decreased stance time - left;Trunk flexed;Wide base of support;Decreased dorsiflexion - left;Drifts right/left Gait velocity: decreased Gait velocity interpretation: Below normal speed for age/gender General Gait Details: pt with less noticable drift towards left with use of walker. Does demo decreased grip on left handle, reminder cues throught out to "hold handle". ? grip strenght vs neglect of left side affecting this. Pt with decreased left foot clearance with gait, able to correct with minimal cues. min assist for balance and walker use/position/negotiation with gait.                                 Modified Rankin (Stroke Patients Only) Modified Rankin (Stroke Patients Only) Pre-Morbid Rankin Score: Moderate disability Modified Rankin: Moderately severe disability     Cognition Arousal/Alertness: Awake/alert (periods of sleepiness, awakes easily) Behavior During Therapy: WFL for tasks assessed/performed Overall Cognitive Status: Impaired/Different from baseline Area of Impairment: Safety/judgement;Problem solving;Awareness;Memory     Memory:  Decreased short-term memory Following Commands: Follows one step commands consistently;Follows multi-step commands  inconsistently Safety/Judgement: Decreased awareness of safety;Decreased awareness of deficits   Problem Solving: Slow processing;Decreased initiation;Difficulty sequencing;Requires verbal cues       Pertinent Vitals/Pain Pain Assessment: No/denies pain     PT Goals (current goals can now be found in the care plan section) Acute Rehab PT Goals Patient Stated Goal: go home PT Goal Formulation: With patient Time For Goal Achievement: 06/16/15 Potential to Achieve Goals: Good Progress towards PT goals: Progressing toward goals    Frequency  Min 4X/week    PT Plan Discharge plan needs to be updated    End of Session Equipment Utilized During Treatment: Gait belt Activity Tolerance: Patient tolerated treatment well;No increased pain;Patient limited by fatigue Patient left: in chair;with call bell/phone within reach;with chair alarm set;with family/visitor present     Time: 2595-6387 PT Time Calculation (min) (ACUTE ONLY): 23 min  Charges:  $Gait Training: 8-22 mins $Self Care/Home Management: 8-22            Sallyanne Kuster 06/10/2015, 10:48 AM   Sallyanne Kuster, PTA, CLT Acute Rehab Services [phone] 06/10/2015, 10:53 AM

## 2015-06-10 NOTE — Progress Notes (Signed)
Pt d/c to home by car with family. Assessment stable. All questions answered. 

## 2015-06-10 NOTE — Discharge Summary (Signed)
[facility] Discharge Summary   Patient: Jose Neal YNW:[id number]   PCP: [facility] DOB: 12-27-1955   Date of admission: 06/07/2015   Date of discharge: 06/10/2015    Discharge Diagnoses:  Principal Problem:   Ataxia Active Problems:   ETOH abuse   Hypertension, essential   Acute CVA (cerebrovascular accident) [facility])   Recommendations for Outpatient Follow-up:  1. Please follow up with neurology and PCP and maintain current medication  Follow-up Information    Follow up with SETHI,PRAMOD, MD. Call in 1 week.   Specialties:  Neurology, Radiology   Why:  appointment in 1 month   Contact information:   [address] [place] [address] [phone]       Follow up with [facility]. Schedule an appointment as soon as possible for a visit in 1 week.   Contact information:   [address] [place] [address] [phone]       Follow up with [facility].   Why:  home health agency   Contact information:   [address] [place] [address] [phone]       Diet recommendation: cardiac diet  Activity: The patient is advised to gradually reintroduce usual activities.  Discharge Condition: good  History of present illness: As per the H and P dictated on admission, "[name] is a 60 y.o. male with hx of HTN and ETOH abuse who presents to ED brought by his daughters for bilat LE weakness and inability to walk. Has been staying w other siblings up until the last 24 hours and drinking ETOH up until yesterday. Patient says having difficulty getting to the bathroom intime. Pt reports drinking a "six-pack" of beer a day, his daughter says he drinks "a lot more than that". In ED VS are stable, and labs unremarkable.   Patient denies any recent issues with CP, cough, SOB, n/v/d , abd pain . No dysuria, no HA, no blurred vision. No seizure activity or jerking. No hx liver  disease. No OTC meds.  First time for admission at [facility] Course:  Summary of his active problems in the hospital is as following.  1. Ataxia  Acute MCA CVA MCA atherosclerotic changes Patient presented with ataxia. MRI is positive for right MCA infarct with 50-75% stenosis of the MCA. Neurology consultation appreciated. Will be on plavix for 3 months and aspirin 325 for life. Home PT and OT,  Lipitor 40 daily. Pt will follow up with PCP and neurology   2. History of alcohol abuse. No evidence of withdrawals.  3. Essential hypertension. Bradycardia Pt had low hear rate at night and did not have any symptoms. Currently will change cardizem to amlodipine and also add HCTZ.  All other chronic medical condition were stable during the hospitalization.  Patient was seen by physical therapy, who recommended home health, which was arranged by Child psychotherapist and case Production designer, theatre/television/film. On the day of the discharge the patient's vitals remained stable, and no other acute medical condition were reported by patient. the patient was felt safe to be discharge at home with home health.  Procedures and Results:  ECHO  Vascular    Consultations:  Neurology   DISCHARGE MEDICATION: Discharge Medication List as of 06/10/2015 10:36 AM    START taking these medications   Details  amLODipine (NORVASC) 5 MG tablet Take 1 tablet (5 mg total) by mouth daily., Starting 06/10/2015, Until Discontinued, Normal  aspirin 325 MG tablet Take 1 tablet (325 mg total) by mouth daily., Starting 06/10/2015, Until Discontinued, Normal    atorvastatin (LIPITOR) 40 MG tablet Take 1 tablet (40 mg total) by mouth daily at 6 PM., Starting 06/10/2015, Until Discontinued, Normal    clopidogrel (PLAVIX) 75 MG tablet Take 1 tablet (75 mg total) by mouth daily., Starting 06/10/2015, Until Discontinued, Normal    folic acid (FOLVITE) 1 MG tablet Take 1 tablet (1 mg total) by mouth daily., Starting 06/10/2015, Until  Discontinued, Normal    hydrochlorothiazide (HYDRODIURIL) 25 MG tablet Take 1 tablet (25 mg total) by mouth daily., Starting 06/10/2015, Until Discontinued, Normal    pantoprazole (PROTONIX) 40 MG tablet Take 1 tablet (40 mg total) by mouth 2 (two) times daily., Starting 06/10/2015, Until Discontinued, Normal    polyethylene glycol (MIRALAX / GLYCOLAX) packet Take 17 g by mouth daily as needed for mild constipation., Starting 06/10/2015, Until Discontinued, Normal    thiamine 100 MG tablet Take 1 tablet (100 mg total) by mouth daily., Starting 06/10/2015, Until Discontinued, Normal      STOP taking these medications     diltiazem (CARDIZEM CD) 180 MG 24 hr capsule        No Known Allergies Discharge Instructions    Diet - low sodium heart healthy    Complete by:  As directed      Discharge instructions    Complete by:  As directed   It is important that you read following instructions as well as go over your medication list with RN to help you understand your care after this hospitalization.  Discharge Instructions: Please follow-up with PCP in one week Please follow up with neurology in 1 month  Please request your primary care physician to go over all Hospital Tests and Procedure/Radiological results at the follow up,  Please get all Hospital records sent to your PCP by signing hospital release before you go home.   Do not drive, operating heavy machinery, perform activities at heights, swimming or participation in water activities or provide baby sitting services; until you have been seen by Primary Care Physician or a Neurologist and advised to do so again. You were cared for by a hospitalist during your hospital stay. If you have any questions about your discharge medications or the care you received while you were in the hospital after you are discharged, you can call the unit and ask to speak with the hospitalist on call if the hospitalist that took care of you is not available.    Once you are discharged, your primary care physician will handle any further medical issues. Please note that NO REFILLS for any discharge medications will be authorized once you are discharged, as it is imperative that you return to your primary care physician (or establish a relationship with a primary care physician if you do not have one) for your aftercare needs so that they can reassess your need for medications and monitor your lab values. You Must read complete instructions/literature along with all the possible adverse reactions/side effects for all the Medicines you take and that have been prescribed to you. Take any new Medicines after you have completely understood and accept all the possible adverse reactions/side effects. Wear Seat belts while driving. If you have smoked or chewed Tobacco in the last 2 yrs please stop smoking and/or stop any Recreational drug use.     Increase activity slowly    Complete by:  As directed  Discharge Exam: Filed Weights   06/07/15 1820  Weight: 86.183 kg (190 lb)   Filed Vitals:   06/10/15 0610 06/10/15 1006  BP: 169/84 153/78  Pulse: 65 61  Temp: 98 F (36.7 C) 98 F (36.7 C)  Resp: 18 17   General: Appear in no distress, no Rash; Oral Mucosa moist. Cardiovascular: S1 and S2 Present, no Murmur, no JVD Respiratory: Bilateral Air entry present and Clear to Auscultation, no Crackles, no wheezes Abdomen: Bowel Sound present, Soft and no tenderness Extremities: no Pedal edema, no calf tenderness Neurology: Grossly no focal neuro deficit.  The results of significant diagnostics from this hospitalization (including imaging, microbiology, ancillary and laboratory) are listed below for reference.    Significant Diagnostic Studies: Dg Chest 2 View  06/07/2015  CLINICAL DATA:  Shortness of breath for 1 day EXAM: CHEST  2 VIEW COMPARISON:  None. FINDINGS: There is no edema or consolidation. The heart size and pulmonary vascularity are  normal. No adenopathy. No bone lesions. IMPRESSION: No edema or consolidation. Electronically Signed   By: Bretta Bang III M.D.   On: 06/07/2015 19:07   Dg Lumbar Spine Complete  06/07/2015  CLINICAL DATA:  Pt reports weakness in legs and SOB on exertion x 1 day. Pt also c/o stiffness in lumbar spine x a few months. No known injury to spine. Hx HTN not controlled with medication and current smoker of 1/2 PPD. EXAM: LUMBAR SPINE - COMPLETE 4+ VIEW COMPARISON:  None. FINDINGS: No fracture.  No spondylolisthesis. Moderate loss of disc height with endplate spurring at L5-S1. Remaining disc spaces are well preserved. No bone lesion. There are vascular calcifications in the pelvis. Soft tissues are otherwise unremarkable. IMPRESSION: 1. No fracture or acute finding.  No bone lesion. 2. Disc degenerative change at L5-S1. Electronically Signed   By: Amie Portland M.D.   On: 06/07/2015 19:08   Ct Head Wo Contrast  06/07/2015  CLINICAL DATA:  Bilateral leg weakness. Unable to walk. Multiple falls. History of hypertension. EXAM: CT HEAD WITHOUT CONTRAST CT CERVICAL SPINE WITHOUT CONTRAST TECHNIQUE: Multidetector CT imaging of the head and cervical spine was performed following the standard protocol without intravenous contrast. Multiplanar CT image reconstructions of the cervical spine were also generated. COMPARISON:  None. FINDINGS: CT HEAD FINDINGS The ventricles are normal configuration. There is ventricular and sulcal enlargement reflecting mild to moderate atrophy, advanced for age. There are no parenchymal masses or mass effect. There is no evidence of a cortical infarct. There is a small lacune infarct in the left caudate nucleus head. Patchy white matter hypoattenuation is noted consistent with mild chronic microvascular ischemic change. There are no extra-axial masses or abnormal fluid collections. There is no intracranial hemorrhage. No skull fracture. Visualized sinuses and mastoid air cells are clear.  CT CERVICAL SPINE FINDINGS No fracture. No spondylolisthesis. There is mild loss disc height most evident at C5-C6. Endplate osteophytes are noted from C4 through the upper thoracic spine. The bones are demineralized. Central spinal canal is relatively well preserved as are the neural foramina. There is straightening of the normal cervical lordosis. Soft tissues are unremarkable. The lung apices show mild centrilobular and paraseptal emphysema. IMPRESSION: HEAD CT: No acute intracranial abnormalities. Advanced atrophy for age and mild chronic microvascular ischemic change. CERVICAL CT:  No fracture or acute finding. Electronically Signed   By: Amie Portland M.D.   On: 06/07/2015 20:35   Ct Cervical Spine Wo Contrast  06/07/2015  CLINICAL DATA:  Bilateral leg weakness. Unable  to walk. Multiple falls. History of hypertension. EXAM: CT HEAD WITHOUT CONTRAST CT CERVICAL SPINE WITHOUT CONTRAST TECHNIQUE: Multidetector CT imaging of the head and cervical spine was performed following the standard protocol without intravenous contrast. Multiplanar CT image reconstructions of the cervical spine were also generated. COMPARISON:  None. FINDINGS: CT HEAD FINDINGS The ventricles are normal configuration. There is ventricular and sulcal enlargement reflecting mild to moderate atrophy, advanced for age. There are no parenchymal masses or mass effect. There is no evidence of a cortical infarct. There is a small lacune infarct in the left caudate nucleus head. Patchy white matter hypoattenuation is noted consistent with mild chronic microvascular ischemic change. There are no extra-axial masses or abnormal fluid collections. There is no intracranial hemorrhage. No skull fracture. Visualized sinuses and mastoid air cells are clear. CT CERVICAL SPINE FINDINGS No fracture. No spondylolisthesis. There is mild loss disc height most evident at C5-C6. Endplate osteophytes are noted from C4 through the upper thoracic spine. The bones  are demineralized. Central spinal canal is relatively well preserved as are the neural foramina. There is straightening of the normal cervical lordosis. Soft tissues are unremarkable. The lung apices show mild centrilobular and paraseptal emphysema. IMPRESSION: HEAD CT: No acute intracranial abnormalities. Advanced atrophy for age and mild chronic microvascular ischemic change. CERVICAL CT:  No fracture or acute finding. Electronically Signed   By: Amie Portland M.D.   On: 06/07/2015 20:35   Mr Brain Wo Contrast  06/08/2015  CLINICAL DATA:  Initial valuation for ataxia. EXAM: MRI HEAD WITHOUT CONTRAST TECHNIQUE: Multiplanar, multiecho pulse sequences of the brain and surrounding structures were obtained without intravenous contrast. COMPARISON:  Prior CT from 06/07/2015. FINDINGS: Study is degraded by motion artifact. There is patchy restricted diffusion involving the right basal ganglia, with predominant involvement involving the caudate head and body. Extension into the right corona radiata as well as the deep white matter of the right centrum semi ovale of the right frontal lobe. Small patchy cortical infarcts within the right frontal lobe as well. Additional tiny punctate cortical infarct or inferiorly within the right occipital lobe (series 5, image 17). No associated mass effect or hemorrhage. Major intracranial vascular flow voids are maintained. Left vertebral artery is probably diminutive. Diffuse prominence of the CSF containing spaces compatible with generalized cerebral atrophy. Extensive patchy and confluent T2/FLAIR hyperintensity within the periventricular and deep white matter both cerebral hemispheres most consistent with chronic small vessel ischemic disease. Superimposed remote lacunar infarcts within the bilateral basal ganglia, thalami, and pons. No areas of chronic hemorrhage. No mass lesion, midline shift, or mass effect. Ventricular prominence related to global parenchymal volume loss  present without hydrocephalus. No extra-axial fluid collection. Craniocervical junction within normal limits. Pituitary gland grossly unremarkable. No acute abnormality about the orbits. Sequela prior lens extraction noted bilaterally. Scattered mucosal thickening within the ethmoidal air cells. No air-fluid levels to suggest active sinus infection. Minimal scattered opacity within the bilateral mastoid air cells. Inner ear structures grossly normal. Bone marrow signal intensity within normal limits. No acute scalp soft tissue abnormality. Probable sebaceous cyst measuring approximate 3 cm within the right face. IMPRESSION: 1. Patchy acute right MCA territory infarcts involving the right basal ganglia and right frontal lobe as above. No associated hemorrhage or significant mass effect. 2. Advanced cerebral atrophy with moderate chronic small vessel ischemic disease with multiple superimposed remote lacunar infarcts as above. Electronically Signed   By: Rise Mu M.D.   On: 06/08/2015 01:48   Mr Maxine Glenn Head/brain  Wo Cm  06/08/2015  CLINICAL DATA:  History of hypertension and ETOH abuse, with BILATERAL lower extremity weakness and inability to walk. EXAM: MRA HEAD WITHOUT CONTRAST TECHNIQUE: Angiographic images of the Circle of Willis were obtained using MRA technique without intravenous contrast. COMPARISON:  MRI brain 06/08/2015 earlier today. FINDINGS: The internal carotid arteries are dolichoectatic but show no flow reducing lesions throughout their cavernous, petrous, cervical, or supraclinoid segments. Basilar artery similarly is widely patent with RIGHT vertebral dominant. Severely diseased distal LEFT vertebral which predominantly contributes to the LEFT PICA. Moderate to severely diseased RIGHT M1 MCA, with potentially tandem flow reducing lesions in its mid and distal segments estimated to be 50-75%. No MCA branch occlusion. No significant disease LEFT M1 MCA with early anterior temporal branch.  No ACA disease of significance. No focal PCA narrowing. No cerebellar branch occlusion. No intracranial aneurysm. 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 Electronically Signed   By: Elsie Stain M.D.   On: 06/08/2015 17:20    Microbiology: No results found for this or any previous visit (from the past 240 hour(s)).   Labs: CBC:  Recent Labs Lab 06/07/15 2021 06/07/15 2031 06/08/15 0314 06/09/15 0640 06/10/15 0246  WBC  --  7.9 7.6  7.0 8.1 8.9  NEUTROABS  --  4.8  --   --   --   HGB 19.4* 14.1 14.6  13.6 15.6 14.0  HCT 57.0* 41.8 42.2  41.1 45.2 41.4  MCV  --  90.3 91.3  91.7 90.6 89.6  PLT  --  332 314  306 320 346   Basic Metabolic Panel:  Recent Labs Lab 06/07/15 2021 06/07/15 2031 06/08/15 0314 06/08/15 1705 06/09/15 1543 06/10/15 0515  NA 140 137 138  --  133* 135  K 3.6 3.9 4.0  --  4.0 3.8  CL 103 103 102  --  103 105  CO2  --  22 23  --  22 22  GLUCOSE 67 73 81  --  104* 133*  BUN --  9 7  CREATININE 1.00 1.02 0.88  0.93  --  1.11 1.00  CALCIUM  --  9.1 9.1  --  8.8* 8.9  MG  --   --   --  2.3  --  2.2   Liver Function Tests:  Recent Labs Lab 06/07/15 2031 06/09/15 1543  AST 19 22  ALT 13* 11*  ALKPHOS 87 83  BILITOT 0.7 0.2*  PROT 7.4 7.0  ALBUMIN 3.7 3.3*   No results for input(s): LIPASE, AMYLASE in the last 168 hours. No results for input(s): AMMONIA in the last 168 hours. Cardiac Enzymes:  Recent Labs Lab 06/07/15 2031 06/08/15 1705 06/08/15 2121  CKTOTAL 130  --   --   TROPONINI  --  <0.03 <0.03   BNP (last 3 results) No results for input(s): BNP in the last 8760 hours. CBG:  Recent Labs Lab 06/09/15 1646 06/09/15 2133 06/10/15 0616 06/10/15 1223 06/10/15 1640  GLUCAP 86 100* 95 83 92   Time spent: 30 minutes  Signed:  Juanantonio Stolar  [facility] 06/10/2015, 10:02 PM

## 2015-06-10 NOTE — Care Management Note (Signed)
Case Management Note  Patient Details  Name: Jose Neal MRN: [id number] Date of Birth: 1955-07-29  Subjective/Objective:                  1. Ataxia  Acute MCA CVA Action/Plan: Discharge planning Expected Discharge Date:  06/11/15               Expected Discharge Plan:  [place]  In-House Referral:     Discharge planning Services  CM Consult  Post Acute Care Choice:  Home Health Choice offered to:  Patient  DME Arranged:  Walker rolling DME Agency:  [place] Arranged:  PT, OT [facility] Agency:  [place]  Status of Service:  Completed, signed off  Medicare Important Message Given:    Date Medicare IM Given:    Medicare IM give by:    Date Additional Medicare IM Given:    Additional Medicare Important Message give by:     If discussed at [place] of Stay Meetings, dates discussed:    Additional Comments: CM met with pt in room to offer choice of home health agency.  Pt chooses [facility] to render HHPT/OT.  Referral called to [facility] rep, tiffany.  Cm called [place] DME rep, Merry Proud to please deliver the rolling walker to room so pt can discharge.  No other CM needs were communicated.  Dellie Catholic, RN 06/10/2015, 1:38 PM

## 2015-06-11 LAB — VITAMIN E: ALPHA-TOCOPHEROL: 6.4 mg/L (ref 5.3–17.5)

## 2015-06-11 LAB — HEMOGLOBIN A1C
HEMOGLOBIN A1C: 5.4 % (ref 4.8–5.6)
Mean Plasma Glucose: 108 mg/dL

## 2015-06-13 ENCOUNTER — Other Ambulatory Visit: Payer: Self-pay

## 2015-06-13 NOTE — Patient Outreach (Signed)
[facility]) Care Management  06/13/2015  Jose Neal Nov 01, 1955 [id number]   EMMI STROKE RED ON EMMI ALERT Alert Day: 06/12/15 Day #1 Alert Reason: unable to get follow up appointment with MD   Outreach attempt #1 to patient. Spoke with son who reported that him and patient were both currently at MD office.    Plan: RN CM will follow up and make outreach attempt call to patient at later time.  Antionette Fairy, RN,BSN,CCM [facility] Care Management Telephonic Care Management Coordinator Direct Phone: [phone] Toll Free: [phone] Fax: [phone]

## 2015-06-14 ENCOUNTER — Other Ambulatory Visit: Payer: Self-pay

## 2015-06-14 NOTE — Patient Outreach (Signed)
[facility]) Care Management  06/14/2015  Jose Neal December 24, 1955 [id number]   EMMI STROKE RED ON EMMI ALERT Alert Day: 06/12/15 Day #1 Alert Reason: unable to get follow up appointment with MD   Outreach attempt to patient as requested earlier by son. No answer at present.   Plan: RN CM will make outreach attempt to patient at later time.  Antionette Fairy, RN,BSN,CCM [facility] Care Management Telephonic Care Management Coordinator Direct Phone: [phone] Toll Free: [phone] Fax: [phone]

## 2015-06-14 NOTE — Patient Outreach (Signed)
[facility]) Care Management  06/14/2015  Jose Neal 05-21-1955 [id number]   EMMI STROKE RED ON EMMI ALERT Alert Day: 06/12/15 Day #1 Alert Reason: unable to get follow up appointment with MD    Outreach attempt #2. Spoke with son who reported patient was still asleep.   Plan: RN CM will make outreach attempt to patient at later time.  Antionette Fairy, RN,BSN,CCM [facility] Care Management Telephonic Care Management Coordinator Direct Phone: [phone] Toll Free: [phone] Fax: [phone]

## 2015-06-15 ENCOUNTER — Other Ambulatory Visit: Payer: Self-pay

## 2015-06-15 NOTE — Patient Outreach (Signed)
[facility] Care Management  06/15/2015  Jose Neal W Fogelman 03/03/1956 [id number]   EMMI STROKE RED ON EMMI ALERT Alert Day: 06/12/15 Day #1 Alert Reason: "unable to get follow up appointment with MD"   Outreach attempt #3 to patient. Spoke with son and advised patient was not with him. Son states that patient does not have a phone at this time and no other way to contact patient. RN CM has made multiple attempts to establish contact with patient with no success. Son did inform RN CM that the patient went to see Dr. August Saucerean at [facility] earlier this week.  Plan: RN CM will notify [facility] administrative assistant of case status.  Antionette Fairyoshanda Toivo Bordon, RN,BSN,CCM [facility] Care Management Telephonic Care Management Coordinator Direct Phone: [phone] Toll Free: [phone] Fax: [phone]

## 2015-07-24 ENCOUNTER — Encounter (HOSPITAL_COMMUNITY): Payer: Self-pay | Admitting: *Deleted

## 2015-07-24 ENCOUNTER — Inpatient Hospital Stay (HOSPITAL_COMMUNITY): Payer: Medicaid Other

## 2015-07-24 ENCOUNTER — Emergency Department (HOSPITAL_COMMUNITY): Payer: Medicaid Other

## 2015-07-24 ENCOUNTER — Inpatient Hospital Stay (HOSPITAL_COMMUNITY)
Admission: EM | Admit: 2015-07-24 | Discharge: 2015-07-26 | DRG: 065 | Disposition: A | Discharge status: Home / Self Care | Payer: Medicaid Other | Attending: Oncology | Admitting: Oncology

## 2015-07-24 DIAGNOSIS — I69398 Other sequelae of cerebral infarction: Secondary | ICD-10-CM

## 2015-07-24 DIAGNOSIS — R414 Neurologic neglect syndrome: Secondary | ICD-10-CM | POA: Diagnosis present

## 2015-07-24 DIAGNOSIS — I6789 Other cerebrovascular disease: Secondary | ICD-10-CM | POA: Diagnosis present

## 2015-07-24 DIAGNOSIS — Z9119 Patient's noncompliance with other medical treatment and regimen: Secondary | ICD-10-CM | POA: Diagnosis not present

## 2015-07-24 DIAGNOSIS — F1010 Alcohol abuse, uncomplicated: Secondary | ICD-10-CM | POA: Diagnosis not present

## 2015-07-24 DIAGNOSIS — E785 Hyperlipidemia, unspecified: Secondary | ICD-10-CM | POA: Insufficient documentation

## 2015-07-24 DIAGNOSIS — G8324 Monoplegia of upper limb affecting left nondominant side: Secondary | ICD-10-CM | POA: Diagnosis present

## 2015-07-24 DIAGNOSIS — R29810 Facial weakness: Secondary | ICD-10-CM | POA: Diagnosis present

## 2015-07-24 DIAGNOSIS — I672 Cerebral atherosclerosis: Secondary | ICD-10-CM | POA: Diagnosis present

## 2015-07-24 DIAGNOSIS — Z8673 Personal history of transient ischemic attack (TIA), and cerebral infarction without residual deficits: Secondary | ICD-10-CM | POA: Insufficient documentation

## 2015-07-24 DIAGNOSIS — R29704 NIHSS score 4: Secondary | ICD-10-CM | POA: Diagnosis present

## 2015-07-24 DIAGNOSIS — Z9181 History of falling: Secondary | ICD-10-CM | POA: Diagnosis not present

## 2015-07-24 DIAGNOSIS — I10 Essential (primary) hypertension: Secondary | ICD-10-CM | POA: Diagnosis present

## 2015-07-24 DIAGNOSIS — F1020 Alcohol dependence, uncomplicated: Secondary | ICD-10-CM | POA: Diagnosis present

## 2015-07-24 DIAGNOSIS — I63511 Cerebral infarction due to unspecified occlusion or stenosis of right middle cerebral artery: Secondary | ICD-10-CM

## 2015-07-24 DIAGNOSIS — I638 Other cerebral infarction: Principal | ICD-10-CM | POA: Diagnosis present

## 2015-07-24 DIAGNOSIS — F17210 Nicotine dependence, cigarettes, uncomplicated: Secondary | ICD-10-CM | POA: Diagnosis present

## 2015-07-24 DIAGNOSIS — Z9114 Patient's other noncompliance with medication regimen: Secondary | ICD-10-CM | POA: Diagnosis not present

## 2015-07-24 DIAGNOSIS — F17200 Nicotine dependence, unspecified, uncomplicated: Secondary | ICD-10-CM | POA: Diagnosis not present

## 2015-07-24 DIAGNOSIS — I6389 Other cerebral infarction: Secondary | ICD-10-CM

## 2015-07-24 DIAGNOSIS — I639 Cerebral infarction, unspecified: Secondary | ICD-10-CM

## 2015-07-24 LAB — CBC
HEMATOCRIT: 38.4 % — AB (ref 39.0–52.0)
HEMATOCRIT: 41.3 % (ref 39.0–52.0)
HEMOGLOBIN: 13.8 g/dL (ref 13.0–17.0)
Hemoglobin: 13.2 g/dL (ref 13.0–17.0)
MCH: 31.1 pg (ref 26.0–34.0)
MCH: 30.3 pg (ref 26.0–34.0)
MCHC: 34.4 g/dL (ref 30.0–36.0)
MCHC: 33.4 g/dL (ref 30.0–36.0)
MCV: 90.4 fL (ref 78.0–100.0)
MCV: 90.6 fL (ref 78.0–100.0)
Platelets: 258 10*3/uL (ref 150–400)
Platelets: 275 10*3/uL (ref 150–400)
RBC: 4.25 MIL/uL (ref 4.22–5.81)
RBC: 4.56 MIL/uL (ref 4.22–5.81)
RDW: 14.7 % (ref 11.5–15.5)
RDW: 14.5 % (ref 11.5–15.5)
WBC: 8.1 10*3/uL (ref 4.0–10.5)
WBC: 8 10*3/uL (ref 4.0–10.5)

## 2015-07-24 LAB — I-STAT CHEM 8, ED
BUN: 15 mg/dL (ref 6–20)
CALCIUM ION: 1.17 mmol/L (ref 1.13–1.30)
CREATININE: 1 mg/dL (ref 0.61–1.24)
Chloride: 104 mmol/L (ref 101–111)
GLUCOSE: 85 mg/dL (ref 65–99)
HCT: 43 % (ref 39.0–52.0)
HEMOGLOBIN: 14.6 g/dL (ref 13.0–17.0)
Potassium: 4.2 mmol/L (ref 3.5–5.1)
Sodium: 142 mmol/L (ref 135–145)
TCO2: 27 mmol/L (ref 0–100)

## 2015-07-24 LAB — COMPREHENSIVE METABOLIC PANEL
ALK PHOS: 92 U/L (ref 38–126)
ALT: 15 U/L — AB (ref 17–63)
AST: 21 U/L (ref 15–41)
Albumin: 3.2 g/dL — ABNORMAL LOW (ref 3.5–5.0)
Anion gap: 10 (ref 5–15)
BUN: 13 mg/dL (ref 6–20)
CALCIUM: 8.8 mg/dL — AB (ref 8.9–10.3)
CO2: 23 mmol/L (ref 22–32)
CREATININE: 1.17 mg/dL (ref 0.61–1.24)
Chloride: 108 mmol/L (ref 101–111)
Glucose, Bld: 90 mg/dL (ref 65–99)
Potassium: 4.4 mmol/L (ref 3.5–5.1)
SODIUM: 141 mmol/L (ref 135–145)
Total Bilirubin: 0.4 mg/dL (ref 0.3–1.2)
Total Protein: 6.4 g/dL — ABNORMAL LOW (ref 6.5–8.1)

## 2015-07-24 LAB — APTT: aPTT: 27 seconds (ref 24–37)

## 2015-07-24 LAB — URINALYSIS, ROUTINE W REFLEX MICROSCOPIC
BILIRUBIN URINE: NEGATIVE
GLUCOSE, UA: NEGATIVE mg/dL
HGB URINE DIPSTICK: NEGATIVE
Ketones, ur: NEGATIVE mg/dL
Leukocytes, UA: NEGATIVE
Nitrite: NEGATIVE
PH: 7.5 (ref 5.0–8.0)
Protein, ur: NEGATIVE mg/dL
SPECIFIC GRAVITY, URINE: 1.013 (ref 1.005–1.030)

## 2015-07-24 LAB — DIFFERENTIAL
BASOS ABS: 0 10*3/uL (ref 0.0–0.1)
Basophils Relative: 0 %
Eosinophils Absolute: 0.1 10*3/uL (ref 0.0–0.7)
Eosinophils Relative: 2 %
LYMPHS PCT: 36 %
Lymphs Abs: 2.9 10*3/uL (ref 0.7–4.0)
MONO ABS: 0.5 10*3/uL (ref 0.1–1.0)
MONOS PCT: 6 %
NEUTROS ABS: 4.5 10*3/uL (ref 1.7–7.7)
Neutrophils Relative %: 56 %

## 2015-07-24 LAB — I-STAT TROPONIN, ED: Troponin i, poc: 0 ng/mL (ref 0.00–0.08)

## 2015-07-24 LAB — RAPID URINE DRUG SCREEN, HOSP PERFORMED
AMPHETAMINES: NOT DETECTED
BENZODIAZEPINES: NOT DETECTED
Barbiturates: NOT DETECTED
COCAINE: NOT DETECTED
Opiates: NOT DETECTED
Tetrahydrocannabinol: NOT DETECTED

## 2015-07-24 LAB — PROTIME-INR
INR: 1 (ref 0.00–1.49)
Prothrombin Time: 13.4 seconds (ref 11.6–15.2)

## 2015-07-24 LAB — ETHANOL

## 2015-07-24 MED ORDER — LORAZEPAM 2 MG/ML IJ SOLN: 1.0000 mg | Freq: Four times a day (QID) | INTRAMUSCULAR | Status: DC | PRN

## 2015-07-24 MED ORDER — ENOXAPARIN SODIUM 40 MG/0.4ML ~~LOC~~ SOLN
40.0000 mg | SUBCUTANEOUS | Status: DC
  Administered 2015-07-24 – 2015-07-25 (×2): 40 mg via SUBCUTANEOUS
  Filled 2015-07-24 (×2): qty 0.4

## 2015-07-24 MED ORDER — LORAZEPAM 1 MG PO TABS: 1.0000 mg | ORAL_TABLET | Freq: Four times a day (QID) | ORAL | Status: DC | PRN

## 2015-07-24 MED ORDER — LORAZEPAM 2 MG/ML IJ SOLN: 0.0000 mg | Freq: Four times a day (QID) | INTRAMUSCULAR | Status: AC

## 2015-07-24 MED ORDER — AMLODIPINE BESYLATE 5 MG PO TABS
5.0000 mg | ORAL_TABLET | Freq: Every day | ORAL | Status: DC
  Administered 2015-07-24 – 2015-07-26 (×3): 5 mg via ORAL
  Filled 2015-07-24 (×3): qty 1

## 2015-07-24 MED ORDER — DEXTROSE-NACL 5-0.9 % IV SOLN: INTRAVENOUS | Status: DC

## 2015-07-24 MED ORDER — SODIUM CHLORIDE 0.9% FLUSH
3.0000 mL | Freq: Two times a day (BID) | INTRAVENOUS | Status: DC
  Administered 2015-07-24 – 2015-07-26 (×5): 3 mL via INTRAVENOUS

## 2015-07-24 MED ORDER — ONDANSETRON HCL 4 MG PO TABS: 4.0000 mg | ORAL_TABLET | Freq: Four times a day (QID) | ORAL | Status: DC | PRN

## 2015-07-24 MED ORDER — THIAMINE HCL 100 MG/ML IJ SOLN
Freq: Once | INTRAVENOUS | Status: AC
  Administered 2015-07-24: 18:00:00 via INTRAVENOUS
  Filled 2015-07-24 (×2): qty 1000

## 2015-07-24 MED ORDER — ONDANSETRON HCL 4 MG/2ML IJ SOLN: 4.0000 mg | Freq: Four times a day (QID) | INTRAMUSCULAR | Status: DC | PRN

## 2015-07-24 MED ORDER — LORAZEPAM 2 MG/ML IJ SOLN: 0.0000 mg | Freq: Two times a day (BID) | INTRAMUSCULAR | Status: DC

## 2015-07-24 MED ORDER — ASPIRIN EC 81 MG PO TBEC
81.0000 mg | DELAYED_RELEASE_TABLET | Freq: Every day | ORAL | Status: DC
  Administered 2015-07-24 – 2015-07-25 (×2): 81 mg via ORAL
  Filled 2015-07-24 (×2): qty 1

## 2015-07-24 NOTE — ED Notes (Signed)
Per EMS- family reported hearing a "thud" in pts room. Went in to find pt on the floor at 9am. Pt noted to have left sided weakness and facial droop. Pt reports feeling fine when he woke and then began having weakness and fell. Pt has hx of previous stroke. Pt has slurred speech as well.

## 2015-07-24 NOTE — ED Notes (Signed)
EKG completed given to EDP.  

## 2015-07-24 NOTE — Consult Note (Signed)
Neurology Consultation Reason for Consult: Left-sided weakness Referring Physician: [name], D  CC: Left-sided weakness  History is obtained from: Patient, daughter  HPI: Jose Neal is a 60 y.o. male who states that he has been having worsening difficulty with his left side over the past week. His daughter was unaware of this, and reported that he was normal when he went to bed last night and therefore a code stroke was activated. When he tried to get up out of bed this morning, he fell and that is what prompted his presentation to the emergency room and the concern for more acute stroke.  He had a stroke in February, but was doing well and recovering, though he had not had access to physical therapy.  LKW: One week ago tpa given?: no, outside of window    ROS: A 14 point ROS was performed and is negative except as noted in the HPI.   Past Medical History  Diagnosis Date  . Hypertension   . Stroke [facility](HCC) 06/07/2015    acute right MCA territory schematic infarctions/notes 06/07/2015     Family History  Problem Relation Age of Onset  . Cancer Mother   . Diabetes Father   . Glaucoma Father   . Cancer Sister   . Glaucoma Other      Social History:  reports that he has been smoking Cigarettes.  He has a 47 pack-year smoking history. He has never used smokeless tobacco. He reports that he drinks about 25.2 oz of alcohol per week. He reports that he does not use illicit drugs.   Exam: Current vital signs: BP 158/95 mmHg  Pulse 56  Temp(Src) 98.3 F (36.8 C)  Resp 17  Wt 86.183 kg (190 lb)  SpO2 95% Vital signs in last 24 hours: Temp:  [98.3 F (36.8 C)] 98.3 F (36.8 C) (04/11 1217) Pulse Rate:  [54-67] 56 (04/11 1230) Resp:  [16-27] 17 (04/11 1230) BP: (151-203)/(79-102) 158/95 mmHg (04/11 1230) SpO2:  [95 %-100 %] 95 % (04/11 1230) Weight:  [86.183 kg (190 lb)] 86.183 kg (190 lb) (04/11 1033)   Physical Exam  Constitutional: Appears well-developed and  well-nourished.  Psych: Affect appropriate to situation Eyes: No scleral injection HENT: No OP obstrucion Head: Normocephalic.  Cardiovascular: Normal rate and regular rhythm.  Respiratory: Effort normal and breath sounds normal to anterior ascultation GI: Soft.  No distension. There is no tenderness.  Skin: WDI  Neuro: Mental Status: Patient is awake, alert, oriented to person, place, month, year, and situation. Patient is able to give a clear and coherent history. No signs of aphasia or neglect Cranial Nerves: II: Visual Fields are full. Pupils are equal, round, and reactive to light.   III,IV, VI: EOMI without ptosis or diploplia.  V: Facial sensation is symmetric to temperature VII: Facial movement is notable for mild left-sided weakness he is slightly dysarthric VIII: hearing is intact to voice X: Uvula elevates symmetrically XI: Shoulder shrug is symmetric. XII: tongue is midline without atrophy or fasciculations.  Motor: Tone is normal. Bulk is normal. 5/5 strength was present in the right arm and leg, he has 4/5 weakness of the left arm and leg Sensory: Sensation is symmetric to pin in the arms and legs. Cerebellar: FNF and HKS are intact on the right, consistent with weakness on the left   I have reviewed labs in epic and the results pertinent to this consultation are: CMP-unremarkable  I have reviewed the images obtained: CT head-old infarct on the right  Impression: 60 year old male with worsening left-sided weakness in the setting of previous stroke. Given his fall, and concern for continued weakness I do think that at the very least he needs to be plugged back and with secondary stroke preventive measures and physical therapy. It is certainly possible that he has had recurrent stroke in the setting of medical noncompliance and multiple tandem lesions in the right M1.  Recommendations: 1) MRI/MRA of the head 2) restart antiplatelet therapy with aspirin 3) okay to  begin carefully lowering blood pressure 4) PT, OT 5) nothing by mouth until nursing swallow screen. 6) stroke team to follow 4/12   Ritta Slot, MD [facility] [phone]  If 7pm- 7am, please page neurology on call as listed in AMION.

## 2015-07-24 NOTE — Code Documentation (Signed)
60[facility] [id number].  Code stroke called in the ED d/t slurred speech and left sided weakness.  Patient to CT.  Stroke team to the bedside.  NIHSS 4, see documentation for details and code stroke times.  Of note, patient with h/o stroke in February with left sided deficits.  Patient was initially reported to be LKW at 0200 when patient's daughter checked in on him, however, patient attempted to get out of bed around 0900 and had worsened left sided weakness which was noticed by his daughter at that time.  Patient reporting that he has been experiencing worsening left sided deficits for a week now, therefore, patient is outside the window for any acute stroke treatment.  Patient's daughter at the bedside and reports that patient's symptoms are very similar to his stroke presentation in February.  Patient missed his f/u appointment and ran out of his medications.  Bedside handoff with ED RN Lockie ParesJaclyn.

## 2015-07-24 NOTE — ED Notes (Signed)
Code stroke called @ 10:39

## 2015-07-24 NOTE — ED Provider Notes (Signed)
CSN: [id number]     Arrival date & time 07/24/15  1026 History   First MD Initiated Contact with Patient 07/24/15 1027     Chief Complaint  Patient presents with  . Extremity Weakness     (Consider location/radiation/quality/duration/timing/severity/associated sxs/prior Treatment) The history is provided by the patient and a relative.  Jose Neal is a 60 y.o. male hx of HTN, recent stroke here with L sided weakness. States that he is been progressively weak for the last week ago. Daughter checked on him around 2 AM and he was sitting watching TV. He woke up shortly before 9 AM he said he feels fine and then felt weaker and fell and hit his head. His daughter heard him fall and went to see him and he was on the floor on umbilical get up. She noticed worsening left-sided weakness as well as some slurred speech that is new. Code stroke activated by me.      Past Medical History  Diagnosis Date  . Hypertension   . Stroke [facility]) 06/07/2015    acute right MCA territory schematic infarctions/notes 06/07/2015   Past Surgical History  Procedure Laterality Date  . No past surgeries     Family History  Problem Relation Age of Onset  . Cancer Mother   . Diabetes Father   . Glaucoma Father   . Cancer Sister   . Glaucoma Other    Social History  Substance Use Topics  . Smoking status: Current Every Day Smoker -- 1.00 packs/day for 47 years    Types: Cigarettes  . Smokeless tobacco: Never Used  . Alcohol Use: 25.2 oz/week    42 Cans of beer per week     Comment: 06/08/2015 "6 pack of beer/day"    Review of Systems  Neurological: Positive for speech difficulty and weakness.  All other systems reviewed and are negative.     Allergies  Review of patient's allergies indicates no known allergies.  Home Medications   Prior to Admission medications   Medication Sig Start Date End Date Taking? Authorizing Provider  amLODipine (NORVASC) 5 MG tablet Take 1 tablet (5 mg total) by  mouth daily. 06/10/15  Yes Rolly Salter, MD  aspirin 325 MG tablet Take 1 tablet (325 mg total) by mouth daily. 06/10/15  Yes Rolly Salter, MD  atorvastatin (LIPITOR) 40 MG tablet Take 1 tablet (40 mg total) by mouth daily at 6 PM. 06/10/15  Yes Rolly Salter, MD  clopidogrel (PLAVIX) 75 MG tablet Take 1 tablet (75 mg total) by mouth daily. 06/10/15  Yes Rolly Salter, MD  folic acid (FOLVITE) 1 MG tablet Take 1 tablet (1 mg total) by mouth daily. 06/10/15  Yes Rolly Salter, MD  hydrochlorothiazide (HYDRODIURIL) 25 MG tablet Take 1 tablet (25 mg total) by mouth daily. 06/10/15  Yes Rolly Salter, MD  pantoprazole (PROTONIX) 40 MG tablet Take 1 tablet (40 mg total) by mouth 2 (two) times daily. 06/10/15  Yes Rolly Salter, MD  polyethylene glycol (MIRALAX / GLYCOLAX) packet Take 17 g by mouth daily as needed for mild constipation. Patient not taking: Reported on 07/24/2015 06/10/15   Rolly Salter, MD  thiamine 100 MG tablet Take 1 tablet (100 mg total) by mouth daily. Patient not taking: Reported on 07/24/2015 06/10/15   Rolly Salter, MD   BP 183/92 mmHg  Pulse 56  Resp 18  Wt 190 lb (86.183 kg)  SpO2 100% Physical Exam  Constitutional:  Chronically ill   HENT:  Head: Normocephalic.  Mouth/Throat: Oropharynx is clear and moist.  Eyes: Conjunctivae are normal. Pupils are equal, round, and reactive to light.  Neck: Normal range of motion. Neck supple.  Cardiovascular: Normal rate, regular rhythm and normal heart sounds.   Pulmonary/Chest: Effort normal and breath sounds normal. No respiratory distress. He has no wheezes. He has no rales.  Abdominal: Soft. Bowel sounds are normal. He exhibits no distension. There is no tenderness. There is no rebound.  Indirect hernia, soft (chronic per patient)   Musculoskeletal: Normal range of motion. He exhibits no edema or tenderness.  Neurological:  L facial droop, strength 4/5 L arm and leg. + slurred speech. Some L dysmetria   Skin: Skin is  warm and dry.  Psychiatric:  Unable   Nursing note and vitals reviewed.   ED Course  Procedures (including critical care time) Labs Review Labs Reviewed  CBC - Abnormal; Notable for the following:    HCT 38.4 (*)    All other components within normal limits  COMPREHENSIVE METABOLIC PANEL - Abnormal; Notable for the following:    Calcium 8.8 (*)    Total Protein 6.4 (*)    Albumin 3.2 (*)    ALT 15 (*)    All other components within normal limits  ETHANOL  PROTIME-INR  APTT  DIFFERENTIAL  URINE RAPID DRUG SCREEN, HOSP PERFORMED  URINALYSIS, ROUTINE W REFLEX MICROSCOPIC (NOT AT [facility])  I-STAT CHEM 8, ED  I-STAT TROPOININ, ED    Imaging Review Ct Head Wo Contrast  07/24/2015  CLINICAL DATA:  60 year old male code stroke. Sudden onset left side weakness. Recent fall. Initial encounter. EXAM: CT HEAD WITHOUT CONTRAST TECHNIQUE: Contiguous axial images were obtained from the base of the skull through the vertex without intravenous contrast. COMPARISON:  None available. FINDINGS: Mild paranasal sinus mucosal thickening. Mastoids are clear. No acute osseous abnormality identified. 3 cm simple fluid density dermal lesion along the right lateral face suggestive of benign cyst such as sebaceous cyst. No acute scalp or orbits soft tissue findings identified. Calcified atherosclerosis at the skull base. Age indeterminate small cortically based infarct in the right superior frontal gyrus pre motor area series 2, image 24. No associated hemorrhage or mass effect. Evidence of fairly extensive underlying chronic small vessel disease including age indeterminate lacunar infarcts in the bilateral deep gray matter nuclei, and patchy bilateral white matter hypodensity. No suspicious intracranial vascular hyperdensity. No ventriculomegaly. No midline shift, mass effect, or evidence of intracranial mass lesion. No acute intracranial hemorrhage identified. IMPRESSION: 1. Small age indeterminate cortical infarct  in the right superior frontal gyrus pre motor area. No associated hemorrhage or mass effect. 2. Advanced underlying chronic small vessel disease. 3. This was discussed in person with Dr. Ritta Slot on 07/24/2015 at 1047 hours. Electronically Signed   By: Odessa Fleming M.D.   On: 07/24/2015 11:16   I have personally reviewed and evaluated these images and lab results as part of my medical decision-making.   EKG Interpretation   Date/Time:  Tuesday July 24 2015 11:28:04 EDT Ventricular Rate:  52 PR Interval:  179 QRS Duration: 86 QT Interval:  431 QTC Calculation: 401 R Axis:   24 Text Interpretation:  Sinus rhythm Anteroseptal infarct, age indeterminate  No significant change since last tracing Confirmed by YAO  MD, DAVID  ([id number]) on 07/24/2015 11:30:10 AM      MDM   Final diagnoses:  None   Adele Dan is a 61 y.o.  male here with L sided weakness, slurred speech. Last normal per patient was shortly before 9am and he has significant deficits so I called code stroke. He told neurology that he has been progressively weaker for the last week so he didn't qualify for TPA. Neuro recommend inpatient workup and possible rehab given significant deficits.   12:03 PM CT head unremarkable. Labs at baseline. Will admit to internal medicine teaching service for stroke workup and rehab     Richardean Canalavid H Yao, MD 07/24/15 1204

## 2015-07-24 NOTE — Evaluation (Signed)
Clinical/Bedside Swallow Evaluation Patient Details  Name: Jose Neal MRN: [id number] Date of Birth: [birth year]  Today's Date: 07/24/2015 Time: SLP Start Time (ACUTE ONLY): 1507 SLP Stop Time (ACUTE ONLY): 1522 SLP Time Calculation (min) (ACUTE ONLY): 15 min  Past Medical History:  Past Medical History  Diagnosis Date  . Hypertension   . Stroke [facility]) 06/07/2015    acute right MCA territory schematic infarctions/notes 06/07/2015   Past Surgical History:  Past Surgical History  Procedure Laterality Date  . No past surgeries     HPI:  Jose Neal is a 60 y.o. male who presented to the ED with stroke like symptoms.  CT revealed a small age indeterminate cortical infarct in the right superior frontal gyrus pre motor area. No associated hemorrhage or mass effect.  PMH significant for stroke in February, 2017.     Assessment / Plan / Recommendation Clinical Impression   Bedside swallow evaluation complete.  Patient's oral motor exam remarkable for left sided lingual deviation and weakness.  Trials of thin liquids resulted in decreased coordination and string cough response in 1/3 trials via cup.  Patient easily distracted with left inattention which impacts overall safety on swallow function.  SLP increased cues to Mod assist level which resulted in no further overt s/s of aspiration with small cup sips.  Regular textures resulted in mild left-sided oral residue as well as intermittent throat clear/cough.  Recommend initiation of a Dys.3 texture thin liquid diet with full staff supervision for strict use of safe swallow precautions.  Hold PO if patient not awake and alert or if coughing with PO.  SLP to follow acutely and recommend MD consider cognitive-linguistic orders given impact on swallow safety.      Aspiration Risk  Mild aspiration risk    Diet Recommendation Dysphagia 3 (Mech soft);Thin liquid   Liquid Administration via: Cup;No straw Medication Administration: Whole meds  with puree Supervision: Patient able to self feed;Full supervision/cueing for compensatory strategies Compensations: Minimize environmental distractions;Slow rate;Small sips/bites;Lingual sweep for clearance of pocketing Postural Changes: Seated upright at 90 degrees    Other  Recommendations Oral Care Recommendations: Oral care BID   Follow up Recommendations  Other (comment);24 hour supervision/assistance (TBD)    Frequency and Duration min 2x/week  2 weeks       Prognosis Prognosis for Safe Diet Advancement: Good Barriers to Reach Goals: Cognitive deficits      Swallow Study   General HPI: Jose Neal is a 60 y.o. male who presented to the ED with stroke like symptoms.  CT revealed a small age indeterminate cortical infarct in the right superior frontal gyrus pre motor area. No associated hemorrhage or mass effect.  PMH significant for stroke in February, 2017.   Type of Study: Bedside Swallow Evaluation Previous Swallow Assessment: none on record Diet Prior to this Study: NPO Temperature Spikes Noted: No Respiratory Status: Room air History of Recent Intubation: No Behavior/Cognition: Alert;Cooperative;Pleasant mood;Distractible;Requires cueing Oral Cavity Assessment: Within Functional Limits Oral Care Completed by SLP: No Oral Cavity - Dentition: Adequate natural dentition Vision: Impaired for self-feeding (left in attention to field and upper extremity ) Self-Feeding Abilities: Able to feed self;Needs assist;Needs set up Patient Positioning: Upright in bed Baseline Vocal Quality: Normal Volitional Cough: Strong Volitional Swallow: Able to elicit    Oral/Motor/Sensory Function Overall Oral Motor/Sensory Function: Mild impairment Facial ROM: Reduced left Facial Symmetry: Abnormal symmetry left Lingual ROM: Reduced left;Suspected CN XII (hypoglossal) dysfunction Lingual Symmetry: Suspected CN XII (hypoglossal) dysfunction;Abnormal symmetry  left Lingual Strength:  Reduced;Suspected CN XII (hypoglossal) dysfunction Velum: Within Functional Limits Mandible: Within Functional Limits   Ice Chips Ice chips: Within functional limits Presentation: Self Fed;Spoon   Thin Liquid Thin Liquid: Impaired Presentation: Cup;Straw Pharyngeal  Phase Impairments: Multiple swallows;Cough - Immediate Other Comments: decresaed coordination in 1/10 trials resulting in immedicate cough; multiple swallows with large boluses     Nectar Thick Nectar Thick Liquid: Not tested   Honey Thick Honey Thick Liquid: Not tested   Puree Puree: Within functional limits Presentation: Self Fed;Spoon   Solid   GO   Solid: Impaired Presentation: Self Fed Oral Phase Impairments: Poor awareness of bolus (lost attention given time needed for mastication ) Oral Phase Functional Implications: Left lateral sulci pocketing Pharyngeal Phase Impairments: Throat Clearing - Immediate;Throat Clearing - Delayed Other Comments: soft solids [facility]       Fae PippinMelissa Venita Seng, M.A., CCC-SLP [phone]   Ronnita Paz 07/24/2015,3:43 PM

## 2015-07-24 NOTE — ED Notes (Signed)
Patient transported to CT 

## 2015-07-24 NOTE — H&P (Signed)
Date: 07/24/2015               Patient Name:  Jose Neal MRN: [id number]  DOB: 04-Oct-1955 Age / Sex: 60 y.o., male   PCP: [facility]         Medical Service: Internal Medicine Teaching Service         Attending Physician: Dr. Levert Feinstein, MD    First Contact: Dr. Ruben Im Pager: [phone]  Second Contact: Dr. Heywood Iles Pager: [phone]       After Hours (After 5p/  First Contact Pager: [phone]  weekends / holidays): Second Contact Pager: [phone]   Chief Complaint: Fall  History of Present Illness:  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 The patient's daughter, Jose Neal, was present to assist with relaying the history. The patient had been staying at [facility] house for his recovery after his previous stroke. Family members witnessed [name] sleeping comfortably in his bed at around 2 and 5 AM last night. At approximately 9:00 AM, [name] her a "thump" from his room and saw that he had fallen of the floor. He was visibly conscious, but reportedly she was slurring his words and seemingly had diminished left-sided strength worse than his baseline. The patient reports not remembering the episode - all he remembers is getting up from bed without prodromal symptoms and falling on the floor. He indicates that his next memory is waking up in the ambulance. By the time he had arrived at the emergency department, the patient and family confirmed that he was back to normal. He denied any bowel or bladder incontinence, tongue biting, vision changes, shaking during the event, or headaches. He only had mild confusion after the event. The patient did not hit her head. Otherwise, he denies any chest pain, heart palpitations, dyspnea, nausea, vomiting, diarrhea, constipation, urinary changes, or dysuria.   Notably, the patient is a  chronic binge drinker of alcohol. Approximately a week ago, [name] disappeared from his daughter's house for two days and was found in a hotel room. He had been drinking malt liquor almost constantly. The daughter estimates that his last drink was 4/6 when she found some malt liquor in his room. The patient and family confirm that they have not identified any withdrawal symptoms - no tremors or hallucinosis. During his binges, it is unclear how many malt liquors will have. The patient has smoked 0.5 ppd for 35 years, and he denies any illicit drug use. Because of his elopement, he had missed an outpatient medical appointment during which his antihypertensives would have been restarted, and he has been without his home HCTZ and amlodipine for at least a week.   With respect with his previous stroke, in February 2017 the patient was at his other daughter's house, who reportedly "does not keep a close on on him," per the daughter. The patient was drinking beer and stood up, after which he immediately fell down. He remembers the entire event. To work up his ataxia, an MRI demonstrated a R MCA infarct in right basal ganglia and frontal lobe with 50-75% stenosis of the MCA. He was discharged on 3 months of clopidogrel and ASA 325 mg for life, as well as Atorvastatin 40 daily.Marland Kitchen He was ordered home PT and OT, and he had apparently made significant improvements with his  daughter's help, able to walk up stairs on his own with only residual left sided weakness. The patient has not been taking his antiplatelet medications.   In the ED, CT head demonstrated right superior frontal gyrus pre motor area, consistent with site of previous infarct. Neurology was consulted who ascertained that a second stroke could not be excluded. MRI head was ordered.     Meds: No current facility-administered medications for this encounter.    Allergies: Allergies as of 07/24/2015  . (No Known Allergies)   Past Medical History    Diagnosis Date  . Hypertension   . Stroke [facility](H[facility]) 06/07/2015    acute right MCA territory schematic infarctions/notes 06/07/2015   Past Surgical History  Procedure Laterality Date  . No past surgeries     Family History  Problem Relation Age of Onset  . Cancer Mother   . Diabetes Father   . Glaucoma Father   . Cancer Sister   . Glaucoma Other    Social History   Social History  . Marital Status: Single    Spouse Name: N/A  . Number of Children: N/A  . Years of Education: N/A   Occupational History  . Not on file.   Social History Main Topics  . Smoking status: Current Every Day Smoker -- 1.00 packs/day for 47 years    Types: Cigarettes  . Smokeless tobacco: Never Used  . Alcohol Use: 25.2 oz/week    42 Cans of beer per week     Comment: 06/08/2015 "6 pack of beer/day"  . Drug Use: No  . Sexual Activity: Yes   Other Topics Concern  . Not on file   Social History Narrative    Review of Systems: All Systems Negative Except per HPI  Physical Exam: Blood pressure 172/100, pulse 56, temperature 98.9 F (37.2 C), temperature source Oral, resp. rate 18, height 6' (1.829 m), weight 190 lb (86.183 kg), SpO2 100 %. General: Lying in bed, NAD HEENT: Lipoma in left maxillary area, MMM, No carotid bruits, no cervical lymphadenopathy. Atraumatic.  Cardiovascular: Regular rate and rhythm, no murmurs, rubs, or gallops Pulmonary: Clear to auscultation bilaterally. Breathing unlabored Abdominal: Liver feels palpable 2 cm beneath 12th rib. Soft, NT/ND. Normal bowel sounds Skin: Warm and dry. No rashes Extremities: No clubbing, cyanosis, or edema Neurological: Speech normal. AAOx4. Difficulty smiling on left. Raises eyebrows symmetrically. Tongue midline. 5/5 strength on right extremities, 4-4+/5 on left extremities. Normal finger to nose. Diminished proprioception in right first toe, normal on left. EOMI. Visual fields in tact. Should shrug symmetric.  Psychiatric: Normal Affect  and Behavior.    Lab results: Basic Metabolic Panel:  Recent Labs  16/01/9603/11/17 1040 07/24/15 1049  NA 141 142  K 4.4 4.2  CL 108 104  CO2 23  --   GLUCOSE 90 85  BUN 13 15  CREATININE 1.17 1.00  CALCIUM 8.8*  --    Liver Function Tests:  Recent Labs  07/24/15 1040  AST 21  ALT 15*  ALKPHOS 92  BILITOT 0.4  PROT 6.4*  ALBUMIN 3.2*   CBC:  Recent Labs  07/24/15 1040 07/24/15 1049  WBC 8.1  --   NEUTROABS 4.5  --   HGB 13.2 14.6  HCT 38.4* 43.0  MCV 90.4  --   PLT 258  --    Coagulation:  Recent Labs  07/24/15 1040  LABPROT 13.4  INR 1.00   Urine Drug Screen:  Alcohol Level:  Recent Labs  07/24/15 1040  ETH <  5   Imaging results:  Ct Head Wo Contrast  07/24/2015  CLINICAL DATA:  60 year old male code stroke. Sudden onset left side weakness. Recent fall. Initial encounter. EXAM: CT HEAD WITHOUT CONTRAST TECHNIQUE: Contiguous axial images were obtained from the base of the skull through the vertex without intravenous contrast. COMPARISON:  None available. FINDINGS: Mild paranasal sinus mucosal thickening. Mastoids are clear. No acute osseous abnormality identified. 3 cm simple fluid density dermal lesion along the right lateral face suggestive of benign cyst such as sebaceous cyst. No acute scalp or orbits soft tissue findings identified. Calcified atherosclerosis at the skull base. Age indeterminate small cortically based infarct in the right superior frontal gyrus pre motor area series 2, image 24. No associated hemorrhage or mass effect. Evidence of fairly extensive underlying chronic small vessel disease including age indeterminate lacunar infarcts in the bilateral deep gray matter nuclei, and patchy bilateral white matter hypodensity. No suspicious intracranial vascular hyperdensity. No ventriculomegaly. No midline shift, mass effect, or evidence of intracranial mass lesion. No acute intracranial hemorrhage identified. IMPRESSION: 1. Small age indeterminate  cortical infarct in the right superior frontal gyrus pre motor area. No associated hemorrhage or mass effect. 2. Advanced underlying chronic small vessel disease. 3. This was discussed in person with Dr. Ritta Slot on 07/24/2015 at 1047 hours. Electronically Signed   By: Odessa Fleming M.D.   On: 07/24/2015 11:16    Other results: Sinus rhythm.  Assessment & Plan by Problem:  Fall in Setting of Recent CVA: Patient has been non-adherent with his antihypertensives, is around 5 days out from his latest drinking binge, and is high risk for another CVA given his significant MCA stenosis. The differential includes recurrent CVA, seizure due to newly developed seizure focus and/or alcohol withdrawal. Loss of consciousness would favor seizure rather than CVA. He has had no prodromal symptoms. Last B12 and Vitamin E from February were normal. Patient does not have. Per neurology recommendations, we will gradually restart her antihypertensive regimen. - MRI pending - ASA 81 mg - Amlodipine 5 mg - PT/OT consult - Dysphagia 3 diet - q2 neuro checks  Alcohol Use Disorder: - IV thiamine, folate - CIWA protocol  Hypertension: 172/100. No vision changes or headache - Amlodipine 5 mg daily  DVT PPx: Lovenox [place]  Dispo: Disposition is deferred at this time, awaiting improvement of current medical problems. Anticipated discharge in approximately 2-3 day(s).   The patient does have a current PCP ([facility]) and does not need an [facility] hospital follow-up appointment after discharge.  The patient does have transportation limitations that hinder transportation to clinic appointments.  Signed: Ruben Im, MD 07/24/2015, 2:22 PM

## 2015-07-25 DIAGNOSIS — I63511 Cerebral infarction due to unspecified occlusion or stenosis of right middle cerebral artery: Secondary | ICD-10-CM

## 2015-07-25 DIAGNOSIS — F1020 Alcohol dependence, uncomplicated: Secondary | ICD-10-CM

## 2015-07-25 DIAGNOSIS — Z8673 Personal history of transient ischemic attack (TIA), and cerebral infarction without residual deficits: Secondary | ICD-10-CM | POA: Insufficient documentation

## 2015-07-25 DIAGNOSIS — I638 Other cerebral infarction: Principal | ICD-10-CM

## 2015-07-25 DIAGNOSIS — I10 Essential (primary) hypertension: Secondary | ICD-10-CM | POA: Insufficient documentation

## 2015-07-25 DIAGNOSIS — F17200 Nicotine dependence, unspecified, uncomplicated: Secondary | ICD-10-CM | POA: Insufficient documentation

## 2015-07-25 DIAGNOSIS — I639 Cerebral infarction, unspecified: Secondary | ICD-10-CM | POA: Insufficient documentation

## 2015-07-25 DIAGNOSIS — E785 Hyperlipidemia, unspecified: Secondary | ICD-10-CM | POA: Insufficient documentation

## 2015-07-25 DIAGNOSIS — F1010 Alcohol abuse, uncomplicated: Secondary | ICD-10-CM | POA: Insufficient documentation

## 2015-07-25 LAB — BASIC METABOLIC PANEL WITH GFR
Anion gap: 10 (ref 5–15)
BUN: 7 mg/dL (ref 6–20)
CO2: 25 mmol/L (ref 22–32)
Calcium: 8.9 mg/dL (ref 8.9–10.3)
Chloride: 104 mmol/L (ref 101–111)
Creatinine, Ser: 0.93 mg/dL (ref 0.61–1.24)
GFR calc Af Amer: >60 mL/min
GFR calc non Af Amer: >60 mL/min
Glucose, Bld: 97 mg/dL (ref 65–99)
Potassium: 3.6 mmol/L (ref 3.5–5.1)
Sodium: 139 mmol/L (ref 135–145)

## 2015-07-25 MED ORDER — CLOPIDOGREL BISULFATE 75 MG PO TABS
75.0000 mg | ORAL_TABLET | Freq: Every day | ORAL | Status: DC
  Administered 2015-07-25 – 2015-07-26 (×2): 75 mg via ORAL
  Filled 2015-07-25 (×2): qty 1

## 2015-07-25 MED ORDER — NALTREXONE HCL 50 MG PO TABS
50.0000 mg | ORAL_TABLET | Freq: Every day | ORAL | Status: DC
  Administered 2015-07-25 – 2015-07-26 (×2): 50 mg via ORAL
  Filled 2015-07-25 (×2): qty 1

## 2015-07-25 MED ORDER — ASPIRIN 325 MG PO TABS
325.0000 mg | ORAL_TABLET | Freq: Every day | ORAL | Status: DC
  Administered 2015-07-26: 325 mg via ORAL
  Filled 2015-07-25: qty 1

## 2015-07-25 MED ORDER — ATORVASTATIN CALCIUM 80 MG PO TABS
80.0000 mg | ORAL_TABLET | Freq: Every day | ORAL | Status: DC
  Administered 2015-07-25: 80 mg via ORAL
  Filled 2015-07-25: qty 1

## 2015-07-25 NOTE — Progress Notes (Signed)
Subjective:  [name] had no complaints this morning. He said he would be interested in medication and community resources to help him address is alcohol abuse.   Objective: Vital signs in last 24 hours: Filed Vitals:   07/25/15 0300 07/25/15 0529 07/25/15 0922 07/25/15 1053  BP: 172/100 176/96 184/85 177/90  Pulse: 58 54 63   Temp: 98.5 F (36.9 C) 97.4 F (36.3 C) 98.4 F (36.9 C)   TempSrc: Oral Oral Oral   Resp: 18 16 16    Height:      Weight:      SpO2: 100% 100% 100%    Weight change:   Intake/Output Summary (Last 24 hours) at 07/25/15 1303 Last data filed at 07/25/15 0428  Gross per 24 hour  Intake      0 ml  Output   2200 ml  Net  -2200 ml   Physical Exam General: Lying in bed, NAD HEENT: Lipoma in left maxillary area, MMM, No carotid bruits, no cervical lymphadenopathy. Atraumatic.  Cardiovascular: Regular rate and rhythm, no murmurs, rubs, or gallops Pulmonary: Clear to auscultation bilaterally. Breathing unlabored Abdominal: Soft, NT/ND. Normal bowel sounds Skin: Warm and dry. No rashes Extremities: No clubbing, cyanosis, or edema Neurological: Speech normal.  Difficulty smiling on left. Raises eyebrows symmetrically. Tongue midline. 5/5 strength on right extremities, 4-4+/5 on left extremities. Normal finger to nose. Visual fields in tact. Should shrug symmetric. Normal reflexes Psychiatric: Normal Affect and Behavior.   Lab Results: Basic Metabolic Panel:  Recent Labs Lab 07/24/15 1040 07/24/15 1049 07/25/15 0550  NA 141 142 139  K 4.4 4.2 3.6  CL 108 104 104  CO2 23  --  25  GLUCOSE 90 85 97  BUN 13 15 7   CREATININE 1.17 1.00 0.93  CALCIUM 8.8*  --  8.9   Liver Function Tests:  Recent Labs Lab 07/24/15 1040  AST 21  ALT 15*  ALKPHOS 92  BILITOT 0.4  PROT 6.4*  ALBUMIN 3.2*   CBC:  Recent Labs Lab 07/24/15 1040 07/24/15 1049 07/24/15 1526  WBC 8.1  --  8.0  NEUTROABS 4.5  --   --   HGB 13.2 14.6 13.8  HCT 38.4* 43.0  41.3  MCV 90.4  --  90.6  PLT 258  --  275   Coagulation:  Recent Labs Lab 07/24/15 1040  LABPROT 13.4  INR 1.00  Urine Drug Screen: Drugs of Abuse     Component Value Date/Time   LABOPIA NONE DETECTED 07/24/2015 1628   COCAINSCRNUR NONE DETECTED 07/24/2015 1628   LABBENZ NONE DETECTED 07/24/2015 1628   AMPHETMU NONE DETECTED 07/24/2015 1628   THCU NONE DETECTED 07/24/2015 1628   LABBARB NONE DETECTED 07/24/2015 1628    Alcohol Level:  Recent Labs Lab 07/24/15 1040  ETH <5   Urinalysis:  Recent Labs Lab 07/24/15 1629  COLORURINE YELLOW  LABSPEC 1.013  PHURINE 7.5  GLUCOSEU NEGATIVE  HGBUR NEGATIVE  BILIRUBINUR NEGATIVE  KETONESUR NEGATIVE  PROTEINUR NEGATIVE  NITRITE NEGATIVE  LEUKOCYTESUR NEGATIVE     Micro Results: No results found for this or any previous visit (from the past 240 hour(s)). Studies/Results: Ct Head Wo Contrast  07/24/2015  CLINICAL DATA:  60 year old male code stroke. Sudden onset left side weakness. Recent fall. Initial encounter. EXAM: CT HEAD WITHOUT CONTRAST TECHNIQUE: Contiguous axial images were obtained from the base of the skull through the vertex without intravenous contrast. COMPARISON:  None available. FINDINGS: Mild paranasal sinus mucosal thickening. Mastoids are clear. No acute  osseous abnormality identified. 3 cm simple fluid density dermal lesion along the right lateral face suggestive of benign cyst such as sebaceous cyst. No acute scalp or orbits soft tissue findings identified. Calcified atherosclerosis at the skull base. Age indeterminate small cortically based infarct in the right superior frontal gyrus pre motor area series 2, image 24. No associated hemorrhage or mass effect. Evidence of fairly extensive underlying chronic small vessel disease including age indeterminate lacunar infarcts in the bilateral deep gray matter nuclei, and patchy bilateral white matter hypodensity. No suspicious intracranial vascular hyperdensity.  No ventriculomegaly. No midline shift, mass effect, or evidence of intracranial mass lesion. No acute intracranial hemorrhage identified. IMPRESSION: 1. Small age indeterminate cortical infarct in the right superior frontal gyrus pre motor area. No associated hemorrhage or mass effect. 2. Advanced underlying chronic small vessel disease. 3. This was discussed in person with Dr. Ritta Slot on 07/24/2015 at 1047 hours. Electronically Signed   By: Odessa Fleming M.D.   On: 07/24/2015 11:16   Mr Maxine Glenn Head Wo Contrast  07/24/2015  CLINICAL DATA:  Unwitnessed fall. Slurred speech and diminished left-sided strength. EXAM: MRI HEAD WITHOUT CONTRAST MRA HEAD WITHOUT CONTRAST TECHNIQUE: Multiplanar, multiecho pulse sequences of the brain and surrounding structures were obtained without intravenous contrast. Angiographic images of the head were obtained using MRA technique without contrast. COMPARISON:  Head CT earlier today.  Brain MRI 06/08/2015. FINDINGS: MRI HEAD FINDINGS Multiple sequences are mildly to moderately motion degraded. There is a small amount of patchy trace diffusion signal abnormality involving the right basal ganglia and adjacent white matter, predominantly at the level of the caudate body. This is overall less than what was present on the prior MRI, however some of this demonstrates mildly reduced ADC suggestive of a small amount of recurrent acute ischemia. Restricted diffusion more superiorly in the right MCA territory on the prior MRI has resolved, with gliosis and developing encephalomalacia now present in these regions. Patchy T2 hyperintensities elsewhere in the cerebral white matter are similar to the prior MRI and compatible with moderate chronic small vessel ischemic disease. There is mild cerebral atrophy. Chronic bilateral basal ganglia, thalamic, and pontine lacunar infarcts are again seen. Prior bilateral cataract extraction is noted. A 3.2 cm sebaceous cyst is again seen in the right  temporal region/face. No significant paranasal sinus or mastoid air cell inflammatory disease is seen. Major intracranial vascular flow voids are grossly preserved, with the right vertebral artery dominant. MRA HEAD FINDINGS Mild motion artifact. The visualized distal vertebral arteries are patent with the right being dominant. AICA and SCA origins are patent. Basilar artery is patent without stenosis. There is a patent left posterior communicating artery. PCAs are patent without evidence of significant proximal stenosis. Internal carotid arteries are patent from skullbase to carotid termini without evidence of significant stenosis. Moderate tandem stenoses are again seen involving the mid and distal right M1 segment. There is an early bifurcation of the left MCA without significant proximal stenosis. No major branch occlusion is seen. ACAs are patent without evidence of significant stenosis. No intracranial aneurysm is identified. IMPRESSION: 1. Small amount of recurrent acute ischemia in the right basal ganglia. 2. Late subacute to chronic right MCA infarcts, acute on the prior MRI. 3. Moderate chronic small vessel ischemic disease with chronic lacunar infarcts as above. 4. No major intracranial arterial occlusion. Moderate right M1 MCA stenoses, unchanged. Electronically Signed   By: Sebastian Ache M.D.   On: 07/24/2015 20:20   Mr Brain Wo Contrast  07/24/2015  CLINICAL DATA:  Unwitnessed fall. Slurred speech and diminished left-sided strength. EXAM: MRI HEAD WITHOUT CONTRAST MRA HEAD WITHOUT CONTRAST TECHNIQUE: Multiplanar, multiecho pulse sequences of the brain and surrounding structures were obtained without intravenous contrast. Angiographic images of the head were obtained using MRA technique without contrast. COMPARISON:  Head CT earlier today.  Brain MRI 06/08/2015. FINDINGS: MRI HEAD FINDINGS Multiple sequences are mildly to moderately motion degraded. There is a small amount of patchy trace diffusion  signal abnormality involving the right basal ganglia and adjacent white matter, predominantly at the level of the caudate body. This is overall less than what was present on the prior MRI, however some of this demonstrates mildly reduced ADC suggestive of a small amount of recurrent acute ischemia. Restricted diffusion more superiorly in the right MCA territory on the prior MRI has resolved, with gliosis and developing encephalomalacia now present in these regions. Patchy T2 hyperintensities elsewhere in the cerebral white matter are similar to the prior MRI and compatible with moderate chronic small vessel ischemic disease. There is mild cerebral atrophy. Chronic bilateral basal ganglia, thalamic, and pontine lacunar infarcts are again seen. Prior bilateral cataract extraction is noted. A 3.2 cm sebaceous cyst is again seen in the right temporal region/face. No significant paranasal sinus or mastoid air cell inflammatory disease is seen. Major intracranial vascular flow voids are grossly preserved, with the right vertebral artery dominant. MRA HEAD FINDINGS Mild motion artifact. The visualized distal vertebral arteries are patent with the right being dominant. AICA and SCA origins are patent. Basilar artery is patent without stenosis. There is a patent left posterior communicating artery. PCAs are patent without evidence of significant proximal stenosis. Internal carotid arteries are patent from skullbase to carotid termini without evidence of significant stenosis. Moderate tandem stenoses are again seen involving the mid and distal right M1 segment. There is an early bifurcation of the left MCA without significant proximal stenosis. No major branch occlusion is seen. ACAs are patent without evidence of significant stenosis. No intracranial aneurysm is identified. IMPRESSION: 1. Small amount of recurrent acute ischemia in the right basal ganglia. 2. Late subacute to chronic right MCA infarcts, acute on the prior  MRI. 3. Moderate chronic small vessel ischemic disease with chronic lacunar infarcts as above. 4. No major intracranial arterial occlusion. Moderate right M1 MCA stenoses, unchanged. Electronically Signed   By: Sebastian Ache M.D.   On: 07/24/2015 20:20   Medications: I have reviewed the patient's current medications. Scheduled Meds: . amLODipine  5 mg Oral Daily  . [START ON 07/26/2015] aspirin  325 mg Oral Daily  . atorvastatin  80 mg Oral q1800  . clopidogrel  75 mg Oral Daily  . enoxaparin (LOVENOX) injection  40 mg Subcutaneous Q24H  . LORazepam  0-4 mg Intravenous Q6H   Followed by  . [START ON 07/26/2015] LORazepam  0-4 mg Intravenous Q12H  . naltrexone  50 mg Oral Daily  . sodium chloride flush  3 mL Intravenous Q12H   Continuous Infusions:  PRN Meds:.LORazepam **OR** LORazepam, ondansetron **OR** ondansetron (ZOFRAN) IV Assessment/Plan:  Recurrent Right Basal Ganglia Infarct: Non-adherence to dual antiplatelet therapy, driven by his alcohol use disorder, could be a contributor. Patient appears to be at neurological baseline. Physical therapy has evaluated the patient with no PT follow-up recommended - ASA 325 mg - Amlodipine 5 mg - Plavix 75 mg daily - Atorvastatin 80 mg daily - Dysphagia 3 diet - q2 neuro checks  Alcohol Use Disorder: Patient agreeable to pharmacologic  intervention and resources - Social work consult - Naltrexone 50 mg daily - IV thiamine, folate - CIWA protocol  Hypertension: 177/90. No vision changes or headache - Amlodipine 5 mg daily  DVT PPx: Lovenox [place]  Dispo: Disposition is deferred at this time, awaiting improvement of current medical problems.  Anticipated discharge in approximately 1-2 day(s).   The patient does have a current PCP ([facility]) and does not need an [facility] hospital follow-up appointment after discharge.  The patient does have transportation limitations that hinder transportation to clinic  appointments.  .Services Needed at time of discharge: Y = Yes, Blank = No PT:   OT:   RN:   Equipment:   Other:     LOS: 1 day   Ruben Im, MD 07/25/2015, 1:03 PM

## 2015-07-25 NOTE — Evaluation (Signed)
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 He "left" his hand on railing as descending steps (2 of 2 trials) to the point his torso rotated and then hand fell off railing. He drifted into Lt wall once and ran into sanitizer dispenser x 1. Due to inattention, feel he needs 24/7 care (which he reports daughter is "nearly always there."). Patient can benefit from acute PT, however on discharge he will [facility]if [facility] and feel OT and SLP needs will be greater.     Follow Up Recommendations No PT follow up; Supervision/Assistance - 24 hour (due to inattention)    Equipment Recommendations  None recommended by PT    Recommendations for Other Services OT consult     Precautions / Restrictions Precautions Precautions: Fall Precaution Comments: pt denies falls      Mobility  Bed Mobility Overal bed mobility: Needs Assistance Bed Mobility: Supine to Sit     Supine to sit: Supervision     General bed mobility comments: for safety  Transfers Overall transfer level: Needs assistance Equipment used: None Transfers: Sit to/from Stand Sit to Stand: Supervision         General transfer comment: x2; no imbalance; wide BOS  Ambulation/Gait Ambulation/Gait assistance: Min assist Ambulation Distance (Feet): 90 Feet Assistive device: None Gait Pattern/deviations: Step-through pattern;Decreased stance time - left;Decreased weight shift to left;Drifts right/left;Wide base of support Gait velocity: decreased Gait velocity interpretation: Below normal speed for age/gender General Gait Details: walks with LUE  held in partial flexion with Lt shoudler retracted; not attending to his left and drifts into wall, soap dispenser  Stairs Stairs: Yes Stairs assistance: Min assist   Number of Stairs: 10 General stair comments: pt initially unclear which side his rail is on at home; continued to use bil rails despite cues; when using Lt hand he would "leave it behind" on the rail until it ultimately causes trunk rotation and hand slips off the rail  Wheelchair Mobility    Modified Rankin (Stroke Patients Only) Modified Rankin (Stroke Patients Only) Pre-Morbid Rankin Score: Moderately severe disability Modified Rankin: Moderately severe disability     Balance Overall balance assessment: Needs assistance Sitting-balance support: Feet supported;No upper extremity supported Sitting balance-Leahy Scale: Good Sitting balance - Comments: reaching to floor to adjust socks   Standing balance support: No upper extremity supported Standing balance-Leahy Scale: Good                   Standardized Balance Assessment Standardized Balance Assessment : Berg Balance Test Berg Balance Test Sit to Stand: Able to stand  independently using hands Standing Unsupported: Able to stand 2 minutes with supervision Sitting with Back Unsupported but Feet Supported on Floor or Stool: Able to sit safely and securely 2 minutes Stand to Sit: Controls descent by using hands Transfers: Able to transfer safely, minor use of hands Standing Unsupported with Eyes Closed: Able to stand 10 seconds with supervision Standing Ubsupported with Feet Together: Able to place feet together independently and stand for 1 minute with supervision From Standing, Reach Forward with Outstretched Arm: Can reach forward >12 cm safely (5") From Standing Position, Pick up Object  from Floor: Able to pick up shoe, needs supervision Turn 360 Degrees: Able to turn 360 degrees safely but slowly Standing Unsupported, One Foot in Front: Needs help to  step but can hold 15 seconds         Pertinent Vitals/Pain Pain Assessment: No/denies pain    Home Living Family/patient expects to be discharged to:: Private residence Living Arrangements: Children (daughter) Available Help at Discharge: Family;Available PRN/intermittently (daughter "there most of time") Type of Home: Apartment Home Access: Level entry     Home Layout: Two level;Bed/bath upstairs Home Equipment: None      Prior Function Level of Independence: Needs assistance   Gait / Transfers Assistance Needed: independent  ADL's / Homemaking Assistance Needed: showers with supervision (never if no one home)        Hand Dominance   Dominant Hand: Right    Extremity/Trunk Assessment   Upper Extremity Assessment: Defer to OT evaluation (inattn LUE when using rail on steps (not advancing hand))           Lower Extremity Assessment: LLE deficits/detail   LLE Deficits / Details: ankle DF 5/5, although slow to fully dorsiflex; knee extension 4/5  Cervical / Trunk Assessment: Other exceptions  Communication   Communication: No difficulties  Cognition Arousal/Alertness: Awake/alert Behavior During Therapy: Flat affect Overall Cognitive Status: No family/caregiver present to determine baseline cognitive functioning Area of Impairment: Attention;Awareness   Current Attention Level: Selective     Safety/Judgement: Decreased awareness of safety;Decreased awareness of deficits Awareness:  (pre-intellectual; Lt inattention)        General Comments      Exercises        Assessment/Plan    PT Assessment Patient needs continued PT services  PT Diagnosis Difficulty walking;Hemiplegia non-dominant side   PT Problem List Decreased strength;Decreased balance;Decreased mobility;Decreased safety awareness;Impaired sensation;Impaired tone  PT Treatment Interventions DME instruction;Gait training;Functional mobility training;Therapeutic activities;Therapeutic  exercise;Balance training;Neuromuscular re-education;Patient/family education;Stair training   PT Goals (Current goals can be found in the Care Plan section) Acute Rehab PT Goals Patient Stated Goal: go home PT Goal Formulation: With patient Time For Goal Achievement: 08/01/15 Potential to Achieve Goals: Good    Frequency Min 4X/week   Barriers to discharge        Co-evaluation               End of Session Equipment Utilized During Treatment: Gait belt Activity Tolerance: Patient tolerated treatment well Patient left: in chair;with call bell/phone within reach;with chair alarm set Nurse Communication: Mobility status;Other (comment) (Lt inattention with running into objects)         Time: [phone] (time adjusted for call to MD) PT Time Calculation (min) (ACUTE ONLY): 26 min   Charges:   PT Evaluation $PT Eval Moderate Complexity: 1 Procedure PT Treatments $Gait Training: 8-22 mins   PT G Codes:        Jose Neal 08-05-15, 12:09 PM Pager [phone]

## 2015-07-25 NOTE — Progress Notes (Signed)
Speech Language Pathology Treatment: Dysphagia  Patient Details Name: Jose Neal Mcpeek MRN: [id number] DOB: 08/08/1955 Today's Date: 07/25/2015 Time: [id number] SLP Time Calculation (min) (ACUTE ONLY): 9 min  Assessment / Plan / Recommendation Clinical Impression  Skilled treatment session focused on addressing dysphagia goals.  SLP facilitated session with skilled observation of regular textures which resulted in immediate cough in 2/3 trials despite Max verbal cues for portion control and pacing.  Lunch tray was delivered with modified textures, Dys.3 and thin liquids which were also observed and resulted in no overt s/s of aspiration with SLP provided Mod verbal cues for portion control and pacing.  Daughter present at end of session and SLP educated her on how to provide full supervision with PO; daughter verbalized understanding of information.     HPI HPI: Jose Neal Sarr is a 60 y.o. male who presented to the ED with stroke like symptoms.  CT revealed a small age indeterminate cortical infarct in the right superior frontal gyrus pre motor area. No associated hemorrhage or mass effect.  PMH significant for stroke in February, 2017.        SLP Plan  Continue with current plan of care     Recommendations  Diet recommendations: Dysphagia 3 (mechanical soft);Thin liquid Liquids provided via: Cup;No straw Medication Administration: Whole meds with puree Supervision: Full supervision/cueing for compensatory strategies;Trained caregiver to feed patient Compensations: Minimize environmental distractions;Slow rate;Small sips/bites;Lingual sweep for clearance of pocketing Postural Changes and/or Swallow Maneuvers: Out of bed for meals;Seated upright 90 degrees             Oral Care Recommendations: Oral care BID Follow up Recommendations: Inpatient Rehab;24 hour supervision/assistance Plan: Continue with current plan of care     GO               Charlane FerrettiMelissa Monita Swier, M.A., CCC-SLP [phone]    Anastyn Ayars 07/25/2015, 4:28 PM

## 2015-07-25 NOTE — Evaluation (Signed)
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 Currently pt is overall min guard for safety with functional mobility and min assist for ADLs. Pt with decreased strength, fine/gross motor coordination in his L UE, and L visual inattention impacting his independence and safety with ADLs and functional mobility. Pt planning to d/c home with 24/7 supervision from family. Recommending outpatient neuro OT for follow up in order to maximize independence and safety with ADLs and functional mobility. Pt would benefit from continued skilled OT to address established goals.    Follow Up Recommendations  Outpatient OT;Supervision/Assistance - 24 hour (neuro)    Equipment Recommendations  None recommended by OT    Recommendations for Other Services       Precautions / Restrictions Precautions Precautions: Fall Precaution Comments: pt denies falls Restrictions Weight Bearing Restrictions: No      Mobility Bed Mobility Overal bed mobility: Needs Assistance Bed Mobility: Supine to Sit;Sit to Supine   Sidelying to sit: Supervision Supine to sit: Supervision     General bed mobility comments: Supervision for safety  Transfers Overall transfer level: Needs assistance Equipment used: None Transfers: Sit to/from Stand Sit to Stand: Supervision         General transfer comment: Supervision for safety. Sit to stand from EOB x1, toilet x 1. No LOB, wide BOS    Balance Overall balance assessment: Needs assistance Sitting-balance support: Feet supported;No upper extremity supported Sitting balance-Leahy Scale: Good Sitting balance - Comments: reaching to floor to adjust socks   Standing balance support: No  upper extremity supported;During functional activity Standing balance-Leahy Scale: Fair Standing balance comment: Posterior lean with static standing during grooming activities; able to corrrect with VCs                 Standardized Balance Assessment Standardized Balance Assessment :          ADL Overall ADL's : Needs assistance/impaired Eating/Feeding: Set up;Sitting   Grooming: Minimal assistance;Standing;Wash/dry hands;Oral care;Cueing for safety;Cueing for sequencing Grooming Details (indicate cue type and reason): Assist for fine motor activities. Upper Body Bathing: Minimal assitance;Sitting;Cueing for sequencing   Lower Body Bathing: Minimal assistance;Sit to/from stand;Cueing for sequencing;Cueing for safety   Upper Body Dressing : Minimal assistance;Sitting;Cueing for sequencing   Lower Body Dressing: Minimal assistance;Sit to/from stand;Cueing for safety;Cueing for sequencing   Toilet Transfer: Min guard;Ambulation;Regular Toilet;Grab bars   Toileting- Clothing Manipulation and Hygiene: Supervision/safety;Sit to/from stand       Functional mobility during ADLs: Min guard General ADL Comments: Pt found attempting to get OOB to bathroom unassisted. Educated pt on incorporating L UE into functional activities, safety in room-calling for assist to bathroom. Pt noted to have L inattention; required VCs for sequencing and visual scanning with grooming task. Discussed need for 24/7 supervision and if daughter would be available to drive pt to outpt OT appointments; pt reports he believes she will be able to provide this for him.     Vision Vision Assessment?: Yes Eye Alignment: Within Functional Limits Alignment/Gaze Preference: Chin down Tracking/Visual Pursuits: Decreased smoothness of horizontal tracking;Decreased smoothness of vertical tracking Visual Fields: Left superior homonymous quadranopsia   Perception     Praxis      Pertinent Vitals/Pain Pain  Assessment:  No/denies pain     Hand Dominance Right   Extremity/Trunk Assessment Upper Extremity Assessment Upper Extremity Assessment: LUE deficits/detail LUE Deficits / Details: ROM overall WFL. Decreased grip strength, shoulder flex 4/5, elbow 4/5. Decreased fine and gross motor coordination. LUE Coordination: decreased fine motor;decreased gross motor   Lower Extremity Assessment Lower Extremity Assessment: Defer to PT evaluation    Cervical / Trunk Assessment Cervical / Trunk Assessment: Other exceptions Cervical / Trunk Exceptions: stands and walks with Lt shoulder retracted and depressed   Communication Communication Communication: No difficulties   Cognition Arousal/Alertness: Awake/alert Behavior During Therapy: Flat affect Overall Cognitive Status: No family/caregiver present to determine baseline cognitive functioning Area of Impairment: Attention;Safety/judgement;Problem solving   Current Attention Level: Selective     Safety/Judgement: Decreased awareness of safety;Decreased awareness of deficits Awareness:  (pre-intellectual; Lt inattention) Problem Solving: Slow processing;Decreased initiation     General Comments       Exercises       Shoulder Instructions      Home Living Family/patient expects to be discharged to:: Private residence Living Arrangements: Children (daughter) Available Help at Discharge: Family;Available PRN/intermittently Type of Home: Apartment Home Access: Level entry     Home Layout: Two level;Bed/bath upstairs Alternate Level Stairs-Number of Steps: 14 Alternate Level Stairs-Rails: Right Bathroom Shower/Tub: Tub/shower unit Shower/tub characteristics: Engineer, building services: Standard     Home Equipment: None          Prior Functioning/Environment Level of Independence: Needs assistance  Gait / Transfers Assistance Needed: independent ADL's / Homemaking Assistance Needed: showers with supervision (never if no one  home)        OT Diagnosis: Generalized weakness;Cognitive deficits;Disturbance of vision;Hemiplegia non-dominant side   OT Problem List: Decreased strength;Decreased range of motion;Impaired balance (sitting and/or standing);Impaired vision/perception;Decreased coordination;Decreased cognition;Decreased safety awareness;Decreased knowledge of use of DME or AE;Decreased knowledge of precautions;Impaired UE functional use   OT Treatment/Interventions: Self-care/ADL training;Therapeutic exercise;Energy conservation;DME and/or AE instruction;Therapeutic activities;Cognitive remediation/compensation;Visual/perceptual remediation/compensation;Patient/family education;Balance training    OT Goals(Current goals can be found in the care plan section) Acute Rehab OT Goals Patient Stated Goal: go home OT Goal Formulation: With patient Time For Goal Achievement: 08/08/15 Potential to Achieve Goals: Good ADL Goals Pt Will Perform Grooming: with supervision;standing Pt Will Perform Upper Body Bathing: with supervision;standing;sitting Pt Will Perform Lower Body Bathing: with supervision;sit to/from stand Pt Will Perform Tub/Shower Transfer: Tub transfer;with supervision;ambulating Pt/caregiver will Perform Home Exercise Program: Increased strength;Left upper extremity;With written HEP provided;With theraputty (increase fine/gross motor coordination)  OT Frequency: Min 2X/week   Barriers to D/C:            Co-evaluation              End of Session    Activity Tolerance: Patient tolerated treatment well Patient left: in bed;with call bell/phone within reach;with bed alarm set   Time: 1610-9604 OT Time Calculation (min): 23 min Charges:  OT General Charges $OT Visit: 1 Procedure OT Evaluation $OT Eval Moderate Complexity: 1 Procedure OT Treatments $Self Care/Home Management : 8-22 mins G-Codes:     Gaye Alken M.S., OTR/L Pager: [phone]  07/25/2015, 2:59 PM

## 2015-07-25 NOTE — Care Management Note (Signed)
Case Management Note  Patient Details  Name: Jose Neal W Helvey MRN: [id number] Date of Birth: 09/29/1955  Subjective/Objective:                    Action/Plan: Patient was admitted with CVA. Lives at home with daughter.  Following his previous CVA, arrangements were made of HH therapy through [facility].  [facility] stating that [facility] preferred outpatient therapies.  CM will follow for discharge needs pending PT/OT evals and physician orders.  Expected Discharge Date:                  Expected Discharge Plan:     In-House Referral:     Discharge planning Services     Post Acute Care Choice:    Choice offered to:     DME Arranged:    DME Agency:     HH Arranged:    HH Agency:     Status of Service:  In process, will continue to follow  Medicare Important Message Given:    Date Medicare IM Given:    Medicare IM give by:    Date Additional Medicare IM Given:    Additional Medicare Important Message give by:     If discussed at Long Length of Stay Meetings, dates discussed:    Additional Comments:  Anda KraftRobarge, Barbara Keng C, RN 07/25/2015, 10:32 AM [phone]

## 2015-07-25 NOTE — Evaluation (Signed)
Speech Language Pathology Evaluation Patient Details Name: Jose Neal MRN: [id number] DOB: 1955/05/04 Today's Date: 07/25/2015 Time: 9147-8295 SLP Time Calculation (min) (ACUTE ONLY): 7 min  Problem List:  Patient Active Problem List   Diagnosis Date Noted  . Cerebrovascular accident (CVA) (HCC)   . Stroke (HCC) 07/24/2015  . 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 ETOH abuse 06/08/2015  . Hypertension, essential 06/08/2015  . Acute CVA (cerebrovascular accident) (HCC) 06/08/2015   Past Medical History:  Past Medical History  Diagnosis Date  . Hypertension   . Stroke [facility]) 06/07/2015    acute right MCA territory schematic infarctions/notes 06/07/2015   Past Surgical History:  Past Surgical History  Procedure Laterality Date  . No past surgeries     HPI:  Jose Neal is a 60 y.o. male who presented to the ED with stroke like symptoms.  CT revealed a small age indeterminate cortical infarct in the right superior frontal gyrus pre motor area. MRI with recurrent Rt basal ganglia CVA (also Rt CVA 05/2015)   Assessment / Plan / Recommendation Clinical Impression  Cognitive-linguistic evaluation complete.  Patient's oral motor exam remarkable for mild left sided weakness; however, speech remains overall intelligible with occasional cues for increased vocal intensity.  Patient also demonstrates moderately severe congitive deficits characterized by impaired sustained attention to basic self-feeding task; requiring redirection after 2-3 minutes.  Patient also demonstrates inconsistent orientation to time, impairments in left attention, recall, and intellectual awareness which impact the patient's ability to solve basic and familiar problems related to self-care tasks.  As a result, skilled SLP services are warranted in the acute and post acute rehab settings.    Patient would be a good candidate for intensive IP Rehab program; MD please consider consult.     SLP Assessment  Patient needs continued  Speech Lanaguage Pathology Services    Follow Up Recommendations  Inpatient Rehab;24 hour supervision/assistance    Frequency and Duration min 2x/week  1 week      SLP Evaluation Prior Functioning  Cognitive/Linguistic Baseline: Within functional limits Baseline deficit details: left inattention from previous CVA had resolved per daughter  Type of Home: Apartment Available Help at Discharge: Family   Cognition  Overall Cognitive Status: Impaired/Different from baseline Arousal/Alertness: Awake/alert Orientation Level: Oriented to person;Oriented to place;Oriented to situation;Disoriented to time Attention: Sustained Sustained Attention: Impaired Sustained Attention Impairment: Functional basic Memory: Impaired Memory Impairment: Storage deficit;Retrieval deficit;Decreased recall of new information Awareness: Impaired Awareness Impairment: Intellectual impairment Problem Solving: Impaired Problem Solving Impairment: Functional basic Executive Function:  (all impaired due to lower level deficits) Behaviors: Impulsive Safety/Judgment: Impaired    Comprehension  Auditory Comprehension Overall Auditory Comprehension: Appears within functional limits for tasks assessed Interfering Components: Attention Visual Recognition/Discrimination Discrimination: Exceptions to [facility]    Expression Expression Primary Mode of Expression: Verbal Verbal Expression Overall Verbal Expression: Appears within functional limits for tasks assessed Written Expression Dominant Hand: Right   Oral / Motor  Oral Motor/Sensory Function Overall Oral Motor/Sensory Function: Mild impairment Facial ROM: Reduced left Facial Symmetry: Abnormal symmetry left Lingual ROM: Reduced left;Suspected CN XII (hypoglossal) dysfunction Lingual Symmetry: Suspected CN XII (hypoglossal) dysfunction;Abnormal symmetry left Lingual Strength: Reduced;Suspected CN XII (hypoglossal) dysfunction Velum: Within Functional  Limits Mandible: Within Functional Limits Motor Speech Overall Motor Speech: Appears within functional limits for tasks assessed (overall intelligible) Effective Techniques: Increased vocal intensity   GO                   Fae Pippin, M.A., CCC-SLP [phone]  Willy Pinkerton 07/25/2015, 4:41 PM

## 2015-07-25 NOTE — Progress Notes (Signed)
STROKE TEAM PROGRESS NOTE   HISTORY OF PRESENT ILLNESS Jose Neal is a 60 y.o. male who states that he has been having worsening difficulty with his left side over the past week. His daughter was unaware of this, and reported that he was normal when he went to bed last night and therefore a code stroke was activated. When he tried to get up out of bed this morning, he fell and that is what prompted his presentation to the emergency room and the concern for more acute stroke. He had a stroke in February, but was doing well and recovering, though he had not had access to physical therapy. He was last known well 1 week ago. Patient was not administered IV t-PA secondary to being outside of the window. He was admitted for further evaluation and treatment.   SUBJECTIVE (INTERVAL HISTORY) His daughter is at the bedside.  She recounted stroke from Feb from which he 100% resolved to new left side weakness from yesterday. Patient denies palpitations. Per daughter, has been caring for patient, he has been compliant with treatment but out of medications for a week. When his disability check comes, he "took off" and was gone for 2 days. He was drinking "lots and lots". Continues to smoke and not quit yet. Patient smirked and laughed.    OBJECTIVE Temp:  [97.4 F (36.3 C)-98.9 F (37.2 C)] 98.4 F (36.9 C) (04/12 0922) Pulse Rate:  [51-84] 63 (04/12 0922) Cardiac Rhythm:  [-] Normal sinus rhythm (04/12 0700) Resp:  [16-27] 16 (04/12 0922) BP: (151-203)/(79-108) 184/85 mmHg (04/12 0922) SpO2:  [95 %-100 %] 100 % (04/12 0922) Weight:  [86.183 kg (190 lb)] 86.183 kg (190 lb) (04/11 1417)  CBC:  Recent Labs Lab 07/24/15 1040 07/24/15 1049 07/24/15 1526  WBC 8.1  --  8.0  NEUTROABS 4.5  --   --   HGB 13.2 14.6 13.8  HCT 38.4* 43.0 41.3  MCV 90.4  --  90.6  PLT 258  --  275    Basic Metabolic Panel:  Recent Labs Lab 07/24/15 1040 07/24/15 1049 07/25/15 0550  NA 141 142 139  K 4.4 4.2  3.6  CL 108 104 104  CO2 23  --  25  GLUCOSE 90 85 97  BUN CREATININE 1.17 1.00 0.93  CALCIUM 8.8*  --  8.9    Lipid Panel:    Component Value Date/Time   CHOL 113 06/09/2015 1543   TRIG 95 06/09/2015 1543   HDL 33* 06/09/2015 1543   CHOLHDL 3.4 06/09/2015 1543   VLDL 19 06/09/2015 1543   LDLCALC 61 06/09/2015 1543   HgbA1c:  Lab Results  Component Value Date   HGBA1C 5.4 06/09/2015   Urine Drug Screen:    Component Value Date/Time   LABOPIA NONE DETECTED 07/24/2015 1628   COCAINSCRNUR NONE DETECTED 07/24/2015 1628   LABBENZ NONE DETECTED 07/24/2015 1628   AMPHETMU NONE DETECTED 07/24/2015 1628   THCU NONE DETECTED 07/24/2015 1628   LABBARB NONE DETECTED 07/24/2015 1628      IMAGING I have personally reviewed the radiological images below and agree with the radiology interpretations.  Ct Head Wo Contrast 07/24/2015  1. Small age indeterminate cortical infarct in the right superior frontal gyrus pre motor area. No associated hemorrhage or mass effect. 2. Advanced underlying chronic small vessel disease. 3.   Mri & Mra Brain Wo Contrast 07/24/2015  1. Small amount of recurrent acute ischemia in the right basal ganglia. 2.  Late subacute to chronic right MCA infarcts, acute on the prior MRI. 3. Moderate chronic small vessel ischemic disease with chronic lacunar infarcts as above. 4. No major intracranial arterial occlusion. Moderate right M1 MCA stenoses, unchanged.   CUS 05/2015 Bilateral: 1-39% ICA stenosis. Vertebral artery flow is antegrade.  2D echo 05/2015 - Left ventricle: The cavity size was normal. Wall thickness was  normal. Systolic function was normal. The estimated ejection  fraction was in the range of 55% to 60%. Wall motion was normal;  there were no regional wall motion abnormalities. Features are  consistent with a pseudonormal left ventricular filling pattern,  with concomitant abnormal relaxation and increased filling  pressure (grade 2  diastolic dysfunction). Doppler parameters are  consistent with high ventricular filling pressure. - Aortic valve: Trileaflet; mildly thickened, mildly calcified  leaflets. - Mitral valve: There was mild regurgitation.   PHYSICAL EXAM  Temp:  [97 F (36.1 C)-99.1 F (37.3 C)] 98.3 F (36.8 C) (04/12 2202) Pulse Rate:  [54-84] 65 (04/12 2202) Resp:  [16-20] 20 (04/12 2202) BP: (166-184)/(84-100) 178/91 mmHg (04/12 2202) SpO2:  [98 %-100 %] 100 % (04/12 2202)  General - Well nourished, well developed, in no apparent distress.  Ophthalmologic - Fundi not visualized due to .  Cardiovascular - Regular rate and rhythm.  Mental Status -  Level of arousal and orientation to time, place, and person were intact. Language including expression, naming, repetition, comprehension was assessed and found intact. Fund of Knowledge was assessed and was intact.  Cranial Nerves II - XII - II - Visual field intact OU. III, IV, VI - Extraocular movements intact. V - Facial sensation intact bilaterally. VII - left mild facial droop. VIII - Hearing & vestibular intact bilaterally. X - Palate elevates symmetrically. XI - Chin turning & shoulder shrug intact bilaterally. XII - Tongue protrusion intact.  Motor Strength - The patient's strength was normal in all extremities except LUE 4/5 distally and pronator drift was present on the left.  Bulk was normal and fasciculations were absent.   Motor Tone - Muscle tone was assessed at the neck and appendages and was normal.  Reflexes - The patient's reflexes were 1+ in all extremities and he had no pathological reflexes.  Sensory - Light touch, temperature/pinprick were assessed and were symmetrical.    Coordination - The patient had normal movements in the hands and feet with no ataxia or dysmetria.  Tremor was absent.  Gait and Station - deferred to PT in room   ASSESSMENT/PLAN [name] is a 60 y.o. male with history of stroke in  February presenting with worsening of left-sided weakness. He did not receive IV t-PA due to delay in arrival outside of the window.   Stroke:  right CR acute infarct likely due to right MCA stenosis and ongoing uncontrolled risk factors. Right MCA infarct embolic pattern about 6 weeks ago due to unknown source  Resultant  Left hand mild paresis, left facial weakness  MRI  Right CR acute infarct. Small vessel disease. Old right MCA infarct  MRA  No significant intracranial stenosis  CUS and TTE unremarkable 05/2015  Outpt 30 day cardiac event monitoring to look for atrial fibrillation   HgbA1c 5.4 in Feb  Lovenox 40 mg sq daily for VTE prophylaxis  DIET DYS 3 Room service appropriate?: Yes; Fluid consistency:: Thin  Patient needs aggressive maximization of medical therapy. aspirin 325 mg daily and clopidogrel 75 mg daily prior to admission, now on aspirin 81  mg daily. Given large vessel intracranial atherosclerosis in R MCA, patient should be treated with aspirin 325 mg and clopidogrel 75 mg orally every day x 3 months for secondary stroke prevention. After 3 months, change to plavix alone. Long-term dual antiplatelets are contraindicated due to risk for intracerebral hemorrhage.   Patient counseled to be compliant with his antithrombotic medications  Therapy recommendations:  No PT follow-up  Disposition:  Return home with family  Hypertension  elevated  Permissive hypertension (OK if < 220/120) but gradually normalize in 5-7 days  Hyperlipidemia  Home meds:  lipitor 40  Recommend increase lipitor to 80 mg daily (ordered) for maximized medical treatment  LDL 61 in February, at goal < 70  Continue statin at discharge  Tobacco abuse  Current smoker  Smoking cessation counseling provided  Nicotine patch provided  Pt is willing to quit  Heavy alcohol abuse  had stopped drinking since last admission until his disability check came, then he left home and drank "lots"  per daughter.   He is followed at a detox center.  Home meds including B1, FA  CIWA protocol  Other Stroke Risk Factors  Hx stroke/TIA  05/2015 R cortical and subcortical MCA and R cerebellar infarcts, embolic, source unknown  Hospital day # 1  Neurology will sign off. Please call with questions. Pt will follow up with Dr. Roda Shutters at [facility] in about 2 months. Thanks for the consult.  Marvel Plan, MD PhD Stroke Neurology 07/25/2015 10:47 PM  To contact Stroke Continuity provider, please refer to [email or web address]. After hours, contact General Neurology

## 2015-07-25 NOTE — Progress Notes (Signed)
PT Cancellation Note  Patient Details Name: Adele DanHoward W Ramo MRN: [id number] DOB: 09/09/1955   Cancelled Treatment:    Reason Eval/Treat Not Completed: Medical issues which prohibited therapy   Noted BP 184/85 with MD goal for SBP <160. Spoke with RN and she will be giving BP meds. Will attempt to see once BP controlled.   Eleanor Dimichele 07/25/2015, 9:39 AM Pager [phone]

## 2015-07-26 DIAGNOSIS — F17200 Nicotine dependence, unspecified, uncomplicated: Secondary | ICD-10-CM

## 2015-07-26 MED ORDER — NICOTINE POLACRILEX 4 MG MT GUM: 4.0000 mg | CHEWING_GUM | OROMUCOSAL | Status: AC | PRN

## 2015-07-26 MED ORDER — ATORVASTATIN CALCIUM 80 MG PO TABS: 80.0000 mg | ORAL_TABLET | Freq: Every day | ORAL | Status: DC

## 2015-07-26 MED ORDER — NICOTINE 7 MG/24HR TD PT24: 7.0000 mg | MEDICATED_PATCH | Freq: Every day | TRANSDERMAL | Status: AC

## 2015-07-26 NOTE — Progress Notes (Signed)
   Subjective:  [name] had no complaints this morning. He denies any hallucinations, headache, anxiety, or tremors.   Objective: Vital signs in last 24 hours: Filed Vitals:   07/25/15 2202 07/26/15 0121 07/26/15 0524 07/26/15 1026  BP: 178/91 179/89 153/99 147/88  Pulse: 65 70 59 63  Temp: 98.3 F (36.8 C) 98.2 F (36.8 C) 97.9 F (36.6 C) 97.6 F (36.4 C)  TempSrc: Oral Oral Oral Oral  Resp: 20 20 20 18   Height:      Weight:      SpO2: 100% 100% 100% 100%   Weight change:   Intake/Output Summary (Last 24 hours) at 07/26/15 1315 Last data filed at 07/26/15 0525  Gross per 24 hour  Intake    480 ml  Output    300 ml  Net    180 ml   Physical Exam General: Lying in bed, NAD HEENT: Lipoma in left maxillary area, MMM, No carotid bruits, no cervical lymphadenopathy. Atraumatic.  Cardiovascular: Regular rate and rhythm, no murmurs, rubs, or gallops Pulmonary: Clear to auscultation bilaterally. Breathing unlabored Abdominal: Soft, NT/ND. Normal bowel sounds Skin: Warm and dry. No rashes Extremities: No clubbing, cyanosis, or edema Neurological: Left hemineglect. Only draws half a clock. Speech normal. Normal finger to nose. Normal reflexes Psychiatric: Normal Affect and Behavior.   Lab Results: Basic Metabolic Panel:  Recent Labs Lab 07/24/15 1040 07/24/15 1049 07/25/15 0550  NA 141 142 139  K 4.4 4.2 3.6  CL 108 104 104  CO2 23  --  25  GLUCOSE 90 85 97  BUN 13 15 7   CREATININE 1.17 1.00 0.93  CALCIUM 8.8*  --  8.9      Medications: I have reviewed the patient's current medications. Scheduled Meds: . amLODipine  5 mg Oral Daily  . aspirin  325 mg Oral Daily  . atorvastatin  80 mg Oral q1800  . clopidogrel  75 mg Oral Daily  . enoxaparin (LOVENOX) injection  40 mg Subcutaneous Q24H  . LORazepam  0-4 mg Intravenous Q6H   Followed by  . LORazepam  0-4 mg Intravenous Q12H  . naltrexone  50 mg Oral Daily  . sodium chloride flush  3 mL Intravenous  Q12H   Continuous Infusions:  PRN Meds:.LORazepam **OR** LORazepam, ondansetron **OR** ondansetron (ZOFRAN) IV Assessment/Plan:  Recurrent Right Basal Ganglia Infarct: Non-adherence to dual antiplatelet therapy, driven by his alcohol use disorder, could be a contributor. Patient appears to be at neurological baseline. Physical therapy has evaluated the patient with no PT follow-up recommended. Patient has Left hemineglect. CIR defers consult. He may have achieved his maximum functionality. - ASA 325 mg - Amlodipine 5 mg - Plavix 75 mg daily - Atorvastatin 80 mg daily - Dysphagia 3 diet  Alcohol Use Disorder: Patient agreeable to pharmacologic intervention and resources - Naltrexone 50 mg daily - CIWA protocol  Hypertension: 147/88. - Amlodipine 5 mg daily  DVT PPx: Lovenox [place]  Dispo: Anticipated discharge home today.   The patient does have a current PCP ([facility]) and [facility] after [facility].  The patient does have transportation limitations that hinder transportation to clinic appointments.  .Services Needed at time of discharge: Y = Yes, Blank = No PT:   OT:   RN:   Equipment:   Other:     LOS: 2 days   Jose ImJeremy Rishit Burkhalter, MD 07/26/2015, 1:15 PM

## 2015-07-26 NOTE — Progress Notes (Signed)
Pt discharged to home with family. IV and telemetry discontinued. Discharge instructions given. Pt questions asked and answered. Pt will be leaving via wheelchair when transportation available. Jose RadarHeather M Nicole Defino

## 2015-07-26 NOTE — Discharge Summary (Signed)
Name: Jose Neal MRN: [id number] DOB: [birth year] 60 y.o. PCP: [facility]  Date of Admission: 07/24/2015 10:26 AM Date of Discharge: 07/27/2015 Attending Physician: No att. providers found  Discharge Diagnosis: 1. Right Basal Ganglia CVA 2. History of CVA 3. Hypertension 4. Alcohol Use Disorder 5. Tobacco Use Disorder  Discharge Medications:   Medication List    STOP taking these medications        hydrochlorothiazide 25 MG tablet  Commonly known as:  HYDRODIURIL     polyethylene glycol packet  Commonly known as:  MIRALAX / GLYCOLAX      TAKE these medications        amLODipine 5 MG tablet  Commonly known as:  NORVASC  Take 1 tablet (5 mg total) by mouth daily.     aspirin 325 MG tablet  Take 1 tablet (325 mg total) by mouth daily.     atorvastatin 80 MG tablet  Commonly known as:  LIPITOR  Take 1 tablet (80 mg total) by mouth daily at 6 PM.     clopidogrel 75 MG tablet  Commonly known as:  PLAVIX  Take 1 tablet (75 mg total) by mouth daily.     folic acid 1 MG tablet  Commonly known as:  FOLVITE  Take 1 tablet (1 mg total) by mouth daily.     naltrexone 50 MG tablet  Commonly known as:  DEPADE  Take 1 tablet (50 mg total) by mouth daily.     nicotine 7 mg/24hr patch  Commonly known as:  NICODERM CQ - dosed in mg/24 hr  Place 1 patch (7 mg total) onto the skin daily.     nicotine polacrilex 4 MG gum  Commonly known as:  [employer] NICOTINE POLACRILEX  Take 1 each (4 mg total) by mouth as needed for smoking cessation.     pantoprazole 40 MG tablet  Commonly known as:  PROTONIX  Take 1 tablet (40 mg total) by mouth 2 (two) times daily.     thiamine 100 MG tablet  Take 1 tablet (100 mg total) by mouth daily.        Disposition and follow-up:   Jose Neal was discharged from [facility] in stable condition.  At the hospital follow up visit please address:  1.  Please assess for medication adherence.  Patient was previously nonadherent to antiplatelet therapy and antihypertensives after his previous stroke.  2. Patient has alcohol use disorder, which feeds into his medication non-adherence. He was started on naltrexone and encouraged to go to AA meetings to address this.   3. Please assess antihypertensive needs.  4. He was deemed appropriate for a Dysphagia 3 diet after an SLP evaluation.  5.  Labs / imaging needed at time of follow-up: None  6.  Pending labs/ test needing follow-up: None  Follow-up Appointments: Follow-up Information    Follow up with Xu,Jindong, MD. Schedule an appointment as soon as possible for a visit in 2 months.   Specialty:  Neurology   Why:  stroke clinic   Contact information:   [address] [place] [address] [phone]       Follow up with [facility] On 08/01/2015.   Why:  at 3:45 PM for blood pressure managment.   Contact information:   [address] [place] [address] [phone]       Follow up with [facility].   Specialty:  Rehabilitation  Why:  They will contact you for the first appointment.    Contact information:   [address] [id number] mc [place] [address] [phone]      Discharge Instructions: Discharge Instructions    Ambulatory referral to Neurology    Complete by:  As directed   Pt will follow up with Dr. Roda Shutters at [facility] in about 2 months. Thanks.     Ambulatory referral to Occupational Therapy    Complete by:  As directed      Ambulatory referral to Physical Therapy    Complete by:  As directed      Ambulatory referral to Speech Therapy    Complete by:  As directed      Diet - low sodium heart healthy    Complete by:  As directed      Increase activity slowly    Complete by:  As directed           Jose Neal,  It was a pleasure taking care of you in the hospital.  You had another stroke.  While not taking your medications may have been a factor, you may have had this stroke regardless. Please take your aspirin, Plavix, amlodipine, and atorvastatin to help prevent this from happening again.  Quitting smoking will be helpful as well. To help you quit, we have prescribed nicotine patches and nicotine gum. The gum is to be used to address cigarette cravings.  For your alcohol use disorder, we have prescribed naltrexone to reduce your cravings. You may also consider joining AA meetings.  Please make it to your follow up appointments with your primary doctor and neurology.  Again, it was a joy taking care of you.  Consultations: Neurology  Procedures Performed:  Ct Head Wo Contrast  07/24/2015  CLINICAL DATA:  60 year old male code stroke. Sudden onset left side weakness. Recent fall. Initial encounter. EXAM: CT HEAD WITHOUT CONTRAST TECHNIQUE: Contiguous axial images were obtained from the base of the skull through the vertex without intravenous contrast. COMPARISON:  None available. FINDINGS: Mild paranasal sinus mucosal thickening. Mastoids are clear. No acute osseous abnormality identified. 3 cm simple fluid density dermal lesion along the right lateral face suggestive of benign cyst such as sebaceous cyst. No acute scalp or orbits soft tissue findings identified. Calcified atherosclerosis at the skull base. Age indeterminate small cortically based infarct in the right superior frontal gyrus pre motor area series 2, image 24. No associated hemorrhage or mass effect. Evidence of fairly extensive underlying chronic small vessel disease including age indeterminate lacunar infarcts in the bilateral deep gray matter nuclei, and patchy bilateral white matter hypodensity. No suspicious intracranial vascular hyperdensity. No ventriculomegaly. No midline shift, mass effect, or evidence of intracranial mass lesion. No acute intracranial hemorrhage identified. IMPRESSION: 1. Small age indeterminate  cortical infarct in the right superior frontal gyrus pre motor area. No associated hemorrhage or mass effect. 2. Advanced underlying chronic small vessel disease. 3. This was discussed in person with Dr. Ritta Slot on 07/24/2015 at 1047 hours. Electronically Signed   By: Odessa Fleming M.D.   On: 07/24/2015 11:16   Mr Maxine Glenn Head Wo Contrast  07/24/2015  CLINICAL DATA:  Unwitnessed fall. Slurred speech and diminished left-sided strength. EXAM: MRI HEAD WITHOUT CONTRAST MRA HEAD WITHOUT CONTRAST TECHNIQUE: Multiplanar, multiecho pulse sequences of the brain and surrounding structures were obtained without intravenous contrast. Angiographic images of the head were obtained using MRA technique without contrast. COMPARISON:  Head CT earlier today.  Brain  MRI 06/08/2015. FINDINGS: MRI HEAD FINDINGS Multiple sequences are mildly to moderately motion degraded. There is a small amount of patchy trace diffusion signal abnormality involving the right basal ganglia and adjacent white matter, predominantly at the level of the caudate body. This is overall less than what was present on the prior MRI, however some of this demonstrates mildly reduced ADC suggestive of a small amount of recurrent acute ischemia. Restricted diffusion more superiorly in the right MCA territory on the prior MRI has resolved, with gliosis and developing encephalomalacia now present in these regions. Patchy T2 hyperintensities elsewhere in the cerebral white matter are similar to the prior MRI and compatible with moderate chronic small vessel ischemic disease. There is mild cerebral atrophy. Chronic bilateral basal ganglia, thalamic, and pontine lacunar infarcts are again seen. Prior bilateral cataract extraction is noted. A 3.2 cm sebaceous cyst is again seen in the right temporal region/face. No significant paranasal sinus or mastoid air cell inflammatory disease is seen. Major intracranial vascular flow voids are grossly preserved, with the right  vertebral artery dominant. MRA HEAD FINDINGS Mild motion artifact. The visualized distal vertebral arteries are patent with the right being dominant. AICA and SCA origins are patent. Basilar artery is patent without stenosis. There is a patent left posterior communicating artery. PCAs are patent without evidence of significant proximal stenosis. Internal carotid arteries are patent from skullbase to carotid termini without evidence of significant stenosis. Moderate tandem stenoses are again seen involving the mid and distal right M1 segment. There is an early bifurcation of the left MCA without significant proximal stenosis. No major branch occlusion is seen. ACAs are patent without evidence of significant stenosis. No intracranial aneurysm is identified. IMPRESSION: 1. Small amount of recurrent acute ischemia in the right basal ganglia. 2. Late subacute to chronic right MCA infarcts, acute on the prior MRI. 3. Moderate chronic small vessel ischemic disease with chronic lacunar infarcts as above. 4. No major intracranial arterial occlusion. Moderate right M1 MCA stenoses, unchanged. Electronically Signed   By: Sebastian AcheAllen  Grady M.D.   On: 07/24/2015 20:20   Mr Brain Wo Contrast  07/24/2015  CLINICAL DATA:  Unwitnessed fall. Slurred speech and diminished left-sided strength. EXAM: MRI HEAD WITHOUT CONTRAST MRA HEAD WITHOUT CONTRAST TECHNIQUE: Multiplanar, multiecho pulse sequences of the brain and surrounding structures were obtained without intravenous contrast. Angiographic images of the head were obtained using MRA technique without contrast. COMPARISON:  Head CT earlier today.  Brain MRI 06/08/2015. FINDINGS: MRI HEAD FINDINGS Multiple sequences are mildly to moderately motion degraded. There is a small amount of patchy trace diffusion signal abnormality involving the right basal ganglia and adjacent white matter, predominantly at the level of the caudate body. This is overall less than what was present on the  prior MRI, however some of this demonstrates mildly reduced ADC suggestive of a small amount of recurrent acute ischemia. Restricted diffusion more superiorly in the right MCA territory on the prior MRI has resolved, with gliosis and developing encephalomalacia now present in these regions. Patchy T2 hyperintensities elsewhere in the cerebral white matter are similar to the prior MRI and compatible with moderate chronic small vessel ischemic disease. There is mild cerebral atrophy. Chronic bilateral basal ganglia, thalamic, and pontine lacunar infarcts are again seen. Prior bilateral cataract extraction is noted. A 3.2 cm sebaceous cyst is again seen in the right temporal region/face. No significant paranasal sinus or mastoid air cell inflammatory disease is seen. Major intracranial vascular flow voids are grossly preserved, with the  right vertebral artery dominant. MRA HEAD FINDINGS Mild motion artifact. The visualized distal vertebral arteries are patent with the right being dominant. AICA and SCA origins are patent. Basilar artery is patent without stenosis. There is a patent left posterior communicating artery. PCAs are patent without evidence of significant proximal stenosis. Internal carotid arteries are patent from skullbase to carotid termini without evidence of significant stenosis. Moderate tandem stenoses are again seen involving the mid and distal right M1 segment. There is an early bifurcation of the left MCA without significant proximal stenosis. No major branch occlusion is seen. ACAs are patent without evidence of significant stenosis. No intracranial aneurysm is identified. IMPRESSION: 1. Small amount of recurrent acute ischemia in the right basal ganglia. 2. Late subacute to chronic right MCA infarcts, acute on the prior MRI. 3. Moderate chronic small vessel ischemic disease with chronic lacunar infarcts as above. 4. No major intracranial arterial occlusion. Moderate right M1 MCA stenoses,  unchanged. Electronically Signed   By: Sebastian Ache M.D.   On: 07/24/2015 20:20    Admission HPI: Mr. Fertig is a 60 year old man with a PMH of HTN, previous CVA (05/2015) with residual left sided deficits, and alcohol use diorder who presents after a fall this morning. The patient's daughter, [name], was present to assist with relaying the history. The patient had been staying at [facility] house for his recovery after his previous stroke. Family members witnessed Mr. Shaul sleeping comfortably in his bed at around 2 and 5 AM last night. At approximately 9:00 AM, [name] her a "thump" from his room and saw that he had fallen of the floor. He was visibly conscious, but reportedly she was slurring his words and seemingly had diminished left-sided strength worse than his baseline. The patient reports not remembering the episode - all he remembers is getting up from bed without prodromal symptoms and falling on the floor. He indicates that his next memory is waking up in the ambulance. By the time he had arrived at the emergency department, the patient and family confirmed that he was back to normal. He denied any bowel or bladder incontinence, tongue biting, vision changes, shaking during the event, or headaches. He only had mild confusion after the event. The patient did not hit her head. Otherwise, he denies any chest pain, heart palpitations, dyspnea, nausea, vomiting, diarrhea, constipation, urinary changes, or dysuria.   Notably, the patient is a chronic binge drinker of alcohol. Approximately a week ago, Mr. Culbreath disappeared from his daughter's house for two days and was found in a hotel room. He had been drinking malt liquor almost constantly. The daughter estimates that his last drink was 4/6 when she found some malt liquor in his room. The patient and family confirm that they have not identified any withdrawal symptoms - no tremors or hallucinosis. During his binges, it is unclear how many malt  liquors will have. The patient has smoked 0.5 ppd for 35 years, and he denies any illicit drug use. Because of his elopement, he had missed an outpatient medical appointment during which his antihypertensives would have been restarted, and he has been without his home HCTZ and amlodipine for at least a week.   With respect with his previous stroke, in February 2017 the patient was at his other daughter's house, who reportedly "does not keep a close on on him," per the daughter. The patient was drinking beer and stood up, after which he immediately fell down. He remembers the entire event. To  work up his ataxia, an MRI demonstrated a R MCA infarct in right basal ganglia and frontal lobe with 50-75% stenosis of the MCA. He was discharged on 3 months of clopidogrel and ASA 325 mg for life, as well as Atorvastatin 40 daily.Marland Kitchen He was ordered home PT and OT, and he had apparently made significant improvements with his daughter's help, able to walk up stairs on his own with only residual left sided weakness. The patient has not been taking his antiplatelet medications.   In the ED, CT head demonstrated right superior frontal gyrus pre motor area, consistent with site of previous infarct. Neurology was consulted who ascertained that a second stroke could not be excluded. MRI head was ordered.   Hospital Course by problem list:  Right Basal Ganglia CVA: Previous stroke last month involving same region as well as the right frontal cortex. This infarct was confirmed with an new area of restricted diffusion of repeat MRI. Patient was started on dual anti-platelet therapy, Plavix and ASA 325, as well as Atorvastatin 80 mg daily. He had returned to his neurologic baseline: 4-4+/5 weakness on the left, left sided facial droop, inattentiveness, and left hemineglect. He was evaluated by PT, OT, and SLP with the recommendation that he have 24 hour supervision. Based on chart review by PM&R, he was deemed inappropriate for  inpatient rehab given his history of non-adherence. He was discharged home with home OT, PT, and SLP.  Hypertension: Patient was initially hypertensive to the 180s/100s which came down to 147/88 on amlodipine 5 mg daily. Home HCTZ was discontinued. He was discharged on amlodipine only.  Alcohol Use Disorder: A consult was placed for social work to provide community resources information for alcohol abuse. He was started on naltrexone 50 mg daily as an inpatient, which he tolerated well. He was discharged on this medication  Tobacco Use Disorder: He was discharged with nicotine patches and gum.  Discharge Vitals:   BP 147/88 mmHg  Pulse 63  Temp(Src) 97.6 F (36.4 C) (Oral)  Resp 18  Ht 6' (1.829 m)  Wt 190 lb (86.183 kg)  BMI 25.76 kg/m2  SpO2 100%  Discharge Labs:  No results found for this or any previous visit (from the past 24 hour(s)).  Signed: Ruben Im, MD 07/27/2015, 10:17 AM    Services Ordered on Discharge: PT, OT, SLP

## 2015-07-26 NOTE — Progress Notes (Signed)
Physical Therapy Treatment Patient Details Name: Jose Neal MRN: [id number] DOB: 1955-06-25 Today's Date: 07/26/2015    History of Present Illness Adm with recurrent Rt basal ganglia CVA (also Rt CVA 05/2015)  PMHx- CVAs, HTN, ETOH abuse    PT Comments    Patient mobilizing bettter today and was able to avoid all obstacles on his left side while walking. Daughter present and states she can manage patient at home at current functional status. Daughter very attentive, aware of patient's deficits, and asking good questions. Spoke with Harvin Hazel, Case Manager re: discharge plan and she stated MD insisted on ordering OPPT.    Follow Up Recommendations  No PT follow up;Supervision/Assistance - 24 hour (due to inattention)     Equipment Recommendations  None recommended by PT    Recommendations for Other Services       Precautions / Restrictions Precautions Precautions: Fall Restrictions Weight Bearing Restrictions: No    Mobility  Bed Mobility Overal bed mobility: Needs Assistance Bed Mobility: Supine to Sit;Sit to Supine   Sidelying to sit: Supervision;HOB elevated Supine to sit: Supervision Sit to supine: Supervision   General bed mobility comments: for safety due to cognition  Transfers Overall transfer level: Needs assistance Equipment used: None Transfers: Sit to/from Stand Sit to Stand: Supervision         General transfer comment: x2; no imbalance; wide BOS  Ambulation/Gait Ambulation/Gait assistance: Min guard Ambulation Distance (Feet): 180 Feet Assistive device: None Gait Pattern/deviations: Step-through pattern;Decreased stride length;Drifts right/left;Wide base of support Gait velocity: decreased Gait velocity interpretation: Below normal speed for age/gender General Gait Details: LUE relaxed today while walking; continues with Lt shoulder retraction; drifting to left but able to consistently avoid obstacles on his left (at times with very small margin,  but did not run into objects or have LOB)   Stairs         General stair comments: daughter reports pt has been forgetting his left hand on the stair rail since his last admission (this is not new)  Wheelchair Mobility    Modified Rankin (Stroke Patients Only) Modified Rankin (Stroke Patients Only) Pre-Morbid Rankin Score: Moderately severe disability Modified Rankin: Moderately severe disability     Balance Overall balance assessment: Needs assistance Sitting-balance support: Feet supported;No upper extremity supported Sitting balance-Leahy Scale: Good     Standing balance support: No upper extremity supported;During functional activity Standing balance-Leahy Scale: Good                      Cognition Arousal/Alertness: Awake/alert Behavior During Therapy: Flat affect Overall Cognitive Status: Impaired/Different from baseline Area of Impairment: Attention;Awareness;Memory   Current Attention Level: Selective Memory: Decreased short-term memory   Safety/Judgement: Decreased awareness of safety;Decreased awareness of deficits Awareness:  (pre-intellectual; Lt inattention) Problem Solving: Slow processing      Exercises      General Comments        Pertinent Vitals/Pain Pain Assessment: No/denies pain    Home Living                      Prior Function            PT Goals (current goals can now be found in the care plan section) Acute Rehab PT Goals Patient Stated Goal: go home Time For Goal Achievement: 08/01/15 Progress towards PT goals: Progressing toward goals    Frequency  Min 4X/week    PT Plan Current plan remains appropriate    Co-evaluation  End of Session Equipment Utilized During Treatment: Gait belt Activity Tolerance: Patient tolerated treatment well Patient left: in bed;with bed alarm set;with call bell/phone within reach;with family/visitor present     Time: 1344-1400 PT Time Calculation  (min) (ACUTE ONLY): 16 min  Charges:  $Gait Training: 8-22 mins                    G Codes:      Denell Cothern 07/26/2015, 2:19 PM Pager [phone]

## 2015-07-26 NOTE — Progress Notes (Signed)
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 Pt continues to require min guard assist for toilet transfers and grooming activity in standing. Reviewed theraputty exercises and provided yellow theraputty/handouts with pt and daughter. Provided extensive pt and family education (see general ADL comments section). Pts daughter with questions re: f/u with outpt therapy and insurance (CM notified). Daughter reports she feels comfortable managing pts needs at home; no further questions or concerns. D/c plan remains appropriate. Will continue to follow acutely.   Follow Up Recommendations  Outpatient OT;Supervision/Assistance - 24 hour (Neuro)    Equipment Recommendations  None recommended by OT    Recommendations for Other Services      Precautions / Restrictions Precautions Precautions: Fall Restrictions Weight Bearing Restrictions: No       Mobility Bed Mobility Overal bed mobility: Needs Assistance Bed Mobility: Supine to Sit   Sidelying to sit: Supervision;HOB elevated       General bed mobility comments: Supervision for safety  Transfers Overall transfer level: Needs assistance Equipment used: None Transfers: Sit to/from Stand Sit to Stand: Supervision         General transfer comment: supervision for safety    Balance Overall balance assessment: Needs assistance Sitting-balance support: Feet supported;No upper extremity supported Sitting balance-Leahy Scale: Good     Standing balance support: No upper extremity supported;During functional activity Standing balance-Leahy Scale: Fair                     ADL Overall ADL's : Needs assistance/impaired     Grooming: Min guard;Wash/dry  hands;Standing                   StatisticianToilet Transfer: Min guard;Ambulation;Regular Toilet;Grab bars   Toileting- Clothing Manipulation and Hygiene: Supervision/safety;Sit to/from stand       Functional mobility during ADLs: Min guard General ADL Comments: Daughter present; discussed pts PLOF since prior hospitalization. Daughter reports she feels like pt was doing well and they were able to manage at home. Daughter feels comfortable with taking pt home again following this hospitalization. Educated pt and daughter on signs/symptoms of stroke and necessity to seek medical assistance with signs and symptoms. Discussed with pt importance of reducing alcohol consumption and tobacco use. Provided pt and daughter with theraputty/exercise handout-daughter reports she understands how to assist pt with exercises. Daughter with questions re: outpt therapy; will defer to CM (CM notified).      Vision                     Perception     Praxis      Cognition   Behavior During Therapy: Flat affect Overall Cognitive Status: Impaired/Different from baseline                       Extremity/Trunk Assessment               Exercises     Shoulder Instructions       General Comments      Pertinent Vitals/ Pain       Pain Assessment: No/denies pain  Home Living  Prior Functioning/Environment              Frequency Min 2X/week     Progress Toward Goals  OT Goals(current goals can now be found in the care plan section)  Progress towards OT goals: Progressing toward goals  Acute Rehab OT Goals Patient Stated Goal: go home  Plan Discharge plan remains appropriate    Co-evaluation                 End of Session     Activity Tolerance Patient tolerated treatment well   Patient Left with call bell/phone within reach;with bed alarm set;with family/visitor present (sitting EOB with daughter  present)   Nurse Communication          Time: 1610-9604 OT Time Calculation (min): 24 min  Charges: OT General Charges $OT Visit: 1 Procedure OT Treatments $Self Care/Home Management : 8-22 mins $Therapeutic Activity: 8-22 mins  Gaye Alken M.S., OTR/L Pager: [phone]  07/26/2015, 1:15 PM

## 2015-07-26 NOTE — Discharge Instructions (Signed)
[name],  It was a pleasure taking care of you in the hospital.  You had another stroke. While not taking your medications may have been a factor, you may have had this stroke regardless. Please take your aspirin, Plavix, amlodipine, and atorvastatin to help prevent this from happening again.  Quitting smoking will be helpful as well. To help you quit, we have prescribed nicotine patches and nicotine gum. The gum is to be used to address cigarette cravings.  For your alcohol use disorder, we have prescribed naltrexone to reduce your cravings. You may also consider joining AA meetings.  Please make it to your follow up appointments with your primary doctor and neurology.  Again, it was a joy taking care of you.

## 2015-07-26 NOTE — Progress Notes (Signed)
Thank you for consult on [name]. Chart reviewed and note that patient has history of non-compliance with significant cognitive deficits. Would recommend 24 hours supervision due to compliance, safety and cognitive deficits.  PT/OT evaluations done yesterday and Outpatient therapy recommended for follow up. Will defer CIR consult.

## 2015-07-26 NOTE — Care Management Note (Signed)
Case Management Note  Patient Details  Name: READ BONELLI MRN: [id number] Date of Birth: Apr 14, 1956  Subjective/Objective:                    Action/Plan: Patient discharging home today self care. Orders for outpatient PT/OT/ST. CM met with the patient and his daughter and they were interested in going to [facility]. Orders placed in EPIC and information placed on the AVS. Bedside RN updated.   Expected Discharge Date:                  Expected Discharge Plan:  Home/Self Care  In-House Referral:     Discharge planning Services  CM Consult  Post Acute Care Choice:    Choice offered to:     DME Arranged:    DME Agency:     HH Arranged:    [place] Agency:     Status of Service:  Completed, signed off  Medicare Important Message Given:    Date Medicare IM Given:    Medicare IM give by:    Date Additional Medicare IM Given:    Additional Medicare Important Message give by:     If discussed at [place] of Stay Meetings, dates discussed:    Additional Comments:  Pollie Friar, RN 07/26/2015, 3:39 PM

## 2015-07-27 ENCOUNTER — Other Ambulatory Visit: Payer: Self-pay | Admitting: Internal Medicine

## 2015-07-27 DIAGNOSIS — F1010 Alcohol abuse, uncomplicated: Secondary | ICD-10-CM

## 2015-07-27 MED ORDER — NALTREXONE HCL 50 MG PO TABS: 50.0000 mg | ORAL_TABLET | Freq: Every day | ORAL | Status: AC

## 2015-09-15 ENCOUNTER — Emergency Department (HOSPITAL_COMMUNITY)
Admission: EM | Admit: 2015-09-15 | Discharge: 2015-09-16 | Disposition: A | Discharge status: Home / Self Care | Payer: Medicaid Other | Attending: Emergency Medicine | Admitting: Emergency Medicine

## 2015-09-15 ENCOUNTER — Encounter (HOSPITAL_COMMUNITY): Payer: Self-pay

## 2015-09-15 DIAGNOSIS — Z7982 Long term (current) use of aspirin: Secondary | ICD-10-CM | POA: Insufficient documentation

## 2015-09-15 DIAGNOSIS — Z79899 Other long term (current) drug therapy: Secondary | ICD-10-CM | POA: Diagnosis not present

## 2015-09-15 DIAGNOSIS — F10129 Alcohol abuse with intoxication, unspecified: Secondary | ICD-10-CM | POA: Diagnosis present

## 2015-09-15 DIAGNOSIS — F17210 Nicotine dependence, cigarettes, uncomplicated: Secondary | ICD-10-CM | POA: Insufficient documentation

## 2015-09-15 DIAGNOSIS — Z8673 Personal history of transient ischemic attack (TIA), and cerebral infarction without residual deficits: Secondary | ICD-10-CM | POA: Insufficient documentation

## 2015-09-15 DIAGNOSIS — F1010 Alcohol abuse, uncomplicated: Secondary | ICD-10-CM

## 2015-09-15 DIAGNOSIS — I10 Essential (primary) hypertension: Secondary | ICD-10-CM | POA: Diagnosis not present

## 2015-09-15 DIAGNOSIS — Z7901 Long term (current) use of anticoagulants: Secondary | ICD-10-CM | POA: Insufficient documentation

## 2015-09-15 LAB — I-STAT CHEM 8, ED
BUN: 7 mg/dL (ref 6–20)
Calcium, Ion: 1.11 mmol/L — ABNORMAL LOW (ref 1.13–1.30)
Chloride: 101 mmol/L (ref 101–111)
Creatinine, Ser: 1.4 mg/dL — ABNORMAL HIGH (ref 0.61–1.24)
Glucose, Bld: 90 mg/dL (ref 65–99)
HCT: 41 % (ref 39.0–52.0)
Hemoglobin: 13.9 g/dL (ref 13.0–17.0)
Potassium: 3.6 mmol/L (ref 3.5–5.1)
Sodium: 139 mmol/L (ref 135–145)
TCO2: 22 mmol/L (ref 0–100)

## 2015-09-15 LAB — ETHANOL: ALCOHOL ETHYL (B): 249 mg/dL — AB (ref <5)

## 2015-09-15 NOTE — ED Notes (Signed)
Bed: [id number] Expected date:  Expected time:  Means of arrival:  Comments: EMS- ETOH from jail

## 2015-09-15 NOTE — ED Notes (Signed)
[facility] "he was too drunk to walk".  He arrives in no distress.  He is polite and cooperative.  He denies any pain or injury or trauma of any kind.

## 2015-09-15 NOTE — ED Provider Notes (Signed)
CSN: [id number]     Arrival date & time 09/15/15  1635 History   First MD Initiated Contact with Patient 09/15/15 1652     Chief Complaint  Patient presents with  . Alcohol Intoxication     (Consider location/radiation/quality/duration/timing/severity/associated sxs/prior Treatment) HPI Comments: Patient here after being arrested for public intoxication and was taken to the jail and could not ambulate. He admits to drinking 2-40 ounce beers this evening. Denies any illicit drug use at this time. No recent illnesses such as vomiting or diarrhea. Denies abdominal chest pain. Because he was unable to ambulate by himself the police presented him here. He denies any complaints of weakness in his arms or legs. No severe headaches. Does have a history of hypertension and has been compliant with his medications.  Patient is a 60 y.o. male presenting with intoxication. The history is provided by the patient.  Alcohol Intoxication    Past Medical History  Diagnosis Date  . Hypertension   . Stroke [facility]) 06/07/2015    acute right MCA territory schematic infarctions/notes 06/07/2015   Past Surgical History  Procedure Laterality Date  . No past surgeries     Family History  Problem Relation Age of Onset  . Cancer Mother   . Diabetes Father   . Glaucoma Father   . Cancer Sister   . Glaucoma Other    Social History  Substance Use Topics  . Smoking status: Current Every Day Smoker -- 1.00 packs/day for 47 years    Types: Cigarettes  . Smokeless tobacco: Never Used  . Alcohol Use: 25.2 oz/week    42 Cans of beer per week     Comment: 06/08/2015 "6 pack of beer/day"    Review of Systems  All other systems reviewed and are negative.     Allergies  Review of patient's allergies indicates no known allergies.  Home Medications   Prior to Admission medications   Medication Sig Start Date End Date Taking? Authorizing Provider  amLODipine (NORVASC) 5 MG tablet Take 1 tablet (5 mg total)  by mouth daily. 06/10/15   Rolly Salter, MD  aspirin 325 MG tablet Take 1 tablet (325 mg total) by mouth daily. 06/10/15   Rolly Salter, MD  atorvastatin (LIPITOR) 80 MG tablet Take 1 tablet (80 mg total) by mouth daily at 6 PM. 07/26/15   Ruben Im, MD  clopidogrel (PLAVIX) 75 MG tablet Take 1 tablet (75 mg total) by mouth daily. 06/10/15   Rolly Salter, MD  folic acid (FOLVITE) 1 MG tablet Take 1 tablet (1 mg total) by mouth daily. 06/10/15   Rolly Salter, MD  naltrexone (DEPADE) 50 MG tablet Take 1 tablet (50 mg total) by mouth daily. 07/27/15   Ruben Im, MD  naltrexone (DEPADE) 50 MG tablet Take 1 tablet (50 mg total) by mouth daily. 07/27/15   Ruben Im, MD  nicotine (NICODERM CQ - DOSED IN MG/24 HR) 7 mg/24hr patch Place 1 patch (7 mg total) onto the skin daily. 07/26/15   Ruben Im, MD  nicotine polacrilex ([employer] NICOTINE POLACRILEX) 4 MG gum Take 1 each (4 mg total) by mouth as needed for smoking cessation. 07/26/15   Ruben Im, MD  pantoprazole (PROTONIX) 40 MG tablet Take 1 tablet (40 mg total) by mouth 2 (two) times daily. 06/10/15   Rolly Salter, MD  thiamine 100 MG tablet Take 1 tablet (100 mg total) by mouth daily. Patient not taking: Reported on 07/24/2015 06/10/15   Edsel Petrin  Carolanne GrumblingM Patel, MD   BP 139/94 mmHg  Pulse 73  Temp(Src) 98.7 F (37.1 C) (Oral)  Resp 16  SpO2 96% Physical Exam  Constitutional: He is oriented to person, place, and time. He appears well-developed and well-nourished.  Non-toxic appearance. No distress.  HENT:  Head: Normocephalic and atraumatic.  Eyes: Conjunctivae, EOM and lids are normal. Pupils are equal, round, and reactive to light.  Neck: Normal range of motion. Neck supple. No tracheal deviation present. No thyroid mass present.  Cardiovascular: Normal rate, regular rhythm and normal heart sounds.  Exam reveals no gallop.   No murmur heard. Pulmonary/Chest: Effort normal and breath sounds normal. No stridor. No respiratory distress. He has no  decreased breath sounds. He has no wheezes. He has no rhonchi. He has no rales.  Abdominal: Soft. Normal appearance and bowel sounds are normal. He exhibits no distension. There is no tenderness. There is no rebound and no CVA tenderness.  Musculoskeletal: Normal range of motion. He exhibits no edema or tenderness.  Neurological: He is alert and oriented to person, place, and time. He has normal strength. No cranial nerve deficit or sensory deficit. GCS eye subscore is 4. GCS verbal subscore is 5. GCS motor subscore is 6.  Skin: Skin is warm and dry. No abrasion and no rash noted.  Psychiatric: He has a normal mood and affect. His speech is normal and behavior is normal.  Nursing note and vitals reviewed.   ED Course  Procedures (including critical care time) Labs Review Labs Reviewed  ETHANOL  I-STAT CHEM 8, ED    Imaging Review No results found. I have personally reviewed and evaluated these images and lab results as part of my medical decision-making.   EKG Interpretation None      MDM   Final diagnoses:  None    Patient will be allowed to sober up and then discharged home    Lorre NickAnthony Rose-Marie Hickling, MD 09/19/15 1436

## 2015-09-15 NOTE — ED Notes (Signed)
Pt given lotion because his feet were itching.  Pt also given water and urinal emptied.

## 2015-09-16 NOTE — ED Provider Notes (Signed)
Pt stable He is improved He is ambulatory D/c home   Jose Rhineonald Joquan Lotz, MD 09/16/15 [id number]

## 2015-09-16 NOTE — Discharge Instructions (Signed)

## 2015-09-16 NOTE — ED Notes (Signed)
Pt ambulate from the room to the bathroom without any assistance and back to the room

## 2015-09-16 NOTE — ED Notes (Signed)
Police officer is trying to locate pt's ID.  Pt given a sandwich for comfort.

## 2015-09-22 ENCOUNTER — Emergency Department (HOSPITAL_COMMUNITY)
Admission: EM | Admit: 2015-09-22 | Discharge: 2015-09-22 | Disposition: A | Discharge status: Home / Self Care | Payer: Medicaid Other | Attending: Emergency Medicine | Admitting: Emergency Medicine

## 2015-09-22 ENCOUNTER — Encounter (HOSPITAL_COMMUNITY): Payer: Self-pay | Admitting: Emergency Medicine

## 2015-09-22 ENCOUNTER — Emergency Department (HOSPITAL_COMMUNITY): Payer: Medicaid Other

## 2015-09-22 DIAGNOSIS — F17210 Nicotine dependence, cigarettes, uncomplicated: Secondary | ICD-10-CM | POA: Insufficient documentation

## 2015-09-22 DIAGNOSIS — Z79899 Other long term (current) drug therapy: Secondary | ICD-10-CM | POA: Diagnosis not present

## 2015-09-22 DIAGNOSIS — I10 Essential (primary) hypertension: Secondary | ICD-10-CM | POA: Insufficient documentation

## 2015-09-22 DIAGNOSIS — R29810 Facial weakness: Secondary | ICD-10-CM | POA: Diagnosis not present

## 2015-09-22 DIAGNOSIS — Z8673 Personal history of transient ischemic attack (TIA), and cerebral infarction without residual deficits: Secondary | ICD-10-CM | POA: Insufficient documentation

## 2015-09-22 DIAGNOSIS — Z0130 Encounter for examination of blood pressure without abnormal findings: Secondary | ICD-10-CM | POA: Diagnosis present

## 2015-09-22 DIAGNOSIS — Z7982 Long term (current) use of aspirin: Secondary | ICD-10-CM | POA: Insufficient documentation

## 2015-09-22 LAB — COMPREHENSIVE METABOLIC PANEL
ALBUMIN: 3.6 g/dL (ref 3.5–5.0)
ALK PHOS: 115 U/L (ref 38–126)
ALT: 20 U/L (ref 17–63)
ANION GAP: 9 (ref 5–15)
AST: 30 U/L (ref 15–41)
BILIRUBIN TOTAL: 0.6 mg/dL (ref 0.3–1.2)
BUN: 12 mg/dL (ref 6–20)
CALCIUM: 9.2 mg/dL (ref 8.9–10.3)
CO2: 23 mmol/L (ref 22–32)
Chloride: 103 mmol/L (ref 101–111)
Creatinine, Ser: 1.08 mg/dL (ref 0.61–1.24)
GFR calc non Af Amer: >60 mL/min (ref >60)
Glucose, Bld: 86 mg/dL (ref 65–99)
POTASSIUM: 4 mmol/L (ref 3.5–5.1)
SODIUM: 135 mmol/L (ref 135–145)
TOTAL PROTEIN: 6.9 g/dL (ref 6.5–8.1)

## 2015-09-22 LAB — CBC
HCT: 38.4 % — ABNORMAL LOW (ref 39.0–52.0)
Hemoglobin: 12.7 g/dL — ABNORMAL LOW (ref 13.0–17.0)
MCH: 29.8 pg (ref 26.0–34.0)
MCHC: 33.1 g/dL (ref 30.0–36.0)
MCV: 90.1 fL (ref 78.0–100.0)
PLATELETS: 289 10*3/uL (ref 150–400)
RBC: 4.26 MIL/uL (ref 4.22–5.81)
RDW: 15.3 % (ref 11.5–15.5)
WBC: 8.1 10*3/uL (ref 4.0–10.5)

## 2015-09-22 LAB — I-STAT TROPONIN, ED: Troponin i, poc: 0 ng/mL (ref 0.00–0.08)

## 2015-09-22 LAB — APTT: aPTT: 29 seconds (ref 24–37)

## 2015-09-22 LAB — I-STAT CHEM 8, ED
BUN: 13 mg/dL (ref 6–20)
CALCIUM ION: 1.14 mmol/L (ref 1.13–1.30)
Chloride: 99 mmol/L — ABNORMAL LOW (ref 101–111)
Creatinine, Ser: 1 mg/dL (ref 0.61–1.24)
Glucose, Bld: 85 mg/dL (ref 65–99)
HEMATOCRIT: 41 % (ref 39.0–52.0)
HEMOGLOBIN: 13.9 g/dL (ref 13.0–17.0)
Potassium: 4 mmol/L (ref 3.5–5.1)
SODIUM: 137 mmol/L (ref 135–145)
TCO2: 24 mmol/L (ref 0–100)

## 2015-09-22 LAB — DIFFERENTIAL
Basophils Absolute: 0 10*3/uL (ref 0.0–0.1)
Basophils Relative: 0 %
EOS ABS: 0.1 10*3/uL (ref 0.0–0.7)
EOS PCT: 1 %
LYMPHS ABS: 2.5 10*3/uL (ref 0.7–4.0)
LYMPHS PCT: 31 %
MONO ABS: 0.5 10*3/uL (ref 0.1–1.0)
Monocytes Relative: 6 %
NEUTROS PCT: 62 %
Neutro Abs: 4.9 10*3/uL (ref 1.7–7.7)

## 2015-09-22 LAB — PROTIME-INR
INR: 1.12 (ref 0.00–1.49)
PROTHROMBIN TIME: 14.6 s (ref 11.6–15.2)

## 2015-09-22 LAB — ETHANOL: Alcohol, Ethyl (B): <5 mg/dL (ref <5)

## 2015-09-22 MED ORDER — ATORVASTATIN CALCIUM 80 MG PO TABS: 80.0000 mg | ORAL_TABLET | Freq: Every day | ORAL | Status: AC

## 2015-09-22 MED ORDER — AMLODIPINE BESYLATE 5 MG PO TABS: 5.0000 mg | ORAL_TABLET | Freq: Every day | ORAL | Status: DC

## 2015-09-22 MED ORDER — PANTOPRAZOLE SODIUM 40 MG PO TBEC: 40.0000 mg | DELAYED_RELEASE_TABLET | Freq: Every day | ORAL | Status: AC

## 2015-09-22 NOTE — ED Notes (Signed)
The pt is alert oriented  His family has disappeared

## 2015-09-22 NOTE — ED Notes (Signed)
P. Stated, I just needed my BP checked cause of my granddaughter wanted it.

## 2015-09-22 NOTE — Discharge Instructions (Signed)
Read the information below.   Your CT was negative for an acute intracranial process. Your blood pressure is mildly elevated. Give your history of strokes in the past it is very important that you take your medication as prescribed. It is also important that you quit smoking.  Use the prescribed medication as directed.  Please discuss all new medications with your pharmacist.   Be sure to call and schedule an appointment with your primary care provider on Monday for re-evaluation and prescription refill.  You may return to the Emergency Department at any time for worsening condition or any new symptoms that concern you. Return to ED if you develop facial droop, slurred speech, worsening weakness, numbness, loss of consciousness, chest pain, shortness of breath.    Hypertension Hypertension, commonly called high blood pressure, is when the force of blood pumping through your arteries is too strong. Your arteries are the blood vessels that carry blood from your heart throughout your body. A blood pressure reading consists of a higher number over a lower number, such as 110/72. The higher number (systolic) is the pressure inside your arteries when your heart pumps. The lower number (diastolic) is the pressure inside your arteries when your heart relaxes. Ideally you want your blood pressure below 120/80. Hypertension forces your heart to work harder to pump blood. Your arteries may become narrow or stiff. Having untreated or uncontrolled hypertension can cause heart attack, stroke, kidney disease, and other problems. RISK FACTORS Some risk factors for high blood pressure are controllable. Others are not.  Risk factors you cannot control include:   Race. You may be at higher risk if you are African American.  Age. Risk increases with age.  Gender. Men are at higher risk than women before age 47 years. After age 38, women are at higher risk than men. Risk factors you can control include:  Not getting  enough exercise or physical activity.  Being overweight.  Getting too much fat, sugar, calories, or salt in your diet.  Drinking too much alcohol. SIGNS AND SYMPTOMS Hypertension does not usually cause signs or symptoms. Extremely high blood pressure (hypertensive crisis) may cause headache, anxiety, shortness of breath, and nosebleed. DIAGNOSIS To check if you have hypertension, your health care provider will measure your blood pressure while you are seated, with your arm held at the level of your heart. It should be measured at least twice using the same arm. Certain conditions can cause a difference in blood pressure between your right and left arms. A blood pressure reading that is higher than normal on one occasion does not mean that you need treatment. If it is not clear whether you have high blood pressure, you may be asked to return on a different day to have your blood pressure checked again. Or, you may be asked to monitor your blood pressure at home for 1 or more weeks. TREATMENT Treating high blood pressure includes making lifestyle changes and possibly taking medicine. Living a healthy lifestyle can help lower high blood pressure. You may need to change some of your habits. Lifestyle changes may include:  Following the DASH diet. This diet is high in fruits, vegetables, and whole grains. It is low in salt, red meat, and added sugars.  Keep your sodium intake below 2,300 mg per day.  Getting at least 30-45 minutes of aerobic exercise at least 4 times per week.  Losing weight if necessary.  Not smoking.  Limiting alcoholic beverages.  Learning ways to reduce stress. Your  health care provider may prescribe medicine if lifestyle changes are not enough to get your blood pressure under control, and if one of the following is true:  You are 7718-61 years of age and your systolic blood pressure is above 140.  You are 61 years of age or older, and your systolic blood pressure is  above 150.  Your diastolic blood pressure is above 90.  You have diabetes, and your systolic blood pressure is over 140 or your diastolic blood pressure is over 90.  You have kidney disease and your blood pressure is above 140/90.  You have heart disease and your blood pressure is above 140/90. Your personal target blood pressure may vary depending on your medical conditions, your age, and other factors. HOME CARE INSTRUCTIONS  Have your blood pressure rechecked as directed by your health care provider.   Take medicines only as directed by your health care provider. Follow the directions carefully. Blood pressure medicines must be taken as prescribed. The medicine does not work as well when you skip doses. Skipping doses also puts you at risk for problems.  Do not smoke.   Monitor your blood pressure at home as directed by your health care provider. SEEK MEDICAL CARE IF:   You think you are having a reaction to medicines taken.  You have recurrent headaches or feel dizzy.  You have swelling in your ankles.  You have trouble with your vision. SEEK IMMEDIATE MEDICAL CARE IF:  You develop a severe headache or confusion.  You have unusual weakness, numbness, or feel faint.  You have severe chest or abdominal pain.  You vomit repeatedly.  You have trouble breathing. MAKE SURE YOU:   Understand these instructions.  Will watch your condition.  Will get help right away if you are not doing well or get worse.   This information is not intended to replace advice given to you by your health care provider. Make sure you discuss any questions you have with your health care provider.   Document Released: 03/31/2005 Document Revised: 08/15/2014 Document Reviewed: 01/21/2013 Elsevier Interactive Patient Education [employer]2016 [employer]

## 2015-09-22 NOTE — ED Provider Notes (Signed)
CSN: [id number]     Arrival date & time 09/22/15  1324 History   First MD Initiated Contact with Patient 09/22/15 1440     Chief Complaint  Patient presents with  . Blood Pressure Check     (Consider location/radiation/quality/duration/timing/severity/associated sxs/prior Treatment) HPI Comments: Adele DanHoward W Petterson is a 60 y.o. male with history of HTN, HLD, ataxia, CVA, and ETOH abuse presents to ED for blood pressure check. Patient reports he ran out of his blood pressure medication three days ago. Patient denies fever, chills, night sweats, slurred speech, shortness of breath, chest pain, numbness, weakness, dizziness, lightheadedness, headaches, seizure, or loss of consciousness. He has a history of CVA. Patient endorses some residual facial droop and left sided weakness. Per medical records, 2 CVA within the last four months.    The history is provided by the patient, medical records and a relative.    Past Medical History  Diagnosis Date  . Hypertension   . Stroke [facility]) 06/07/2015    acute right MCA territory schematic infarctions/notes 06/07/2015   Past Surgical History  Procedure Laterality Date  . No past surgeries     Family History  Problem Relation Age of Onset  . Cancer Mother   . Diabetes Father   . Glaucoma Father   . Cancer Sister   . Glaucoma Other    Social History  Substance Use Topics  . Smoking status: Current Every Day Smoker -- 1.00 packs/day for 47 years    Types: Cigarettes  . Smokeless tobacco: Never Used  . Alcohol Use: 25.2 oz/week    42 Cans of beer per week     Comment: 06/08/2015 "6 pack of beer/day"    Review of Systems  Constitutional: Negative for fever, chills, diaphoresis and fatigue.  HENT: Negative for trouble swallowing.   Eyes: Negative for visual disturbance.  Respiratory: Negative for cough and shortness of breath.   Cardiovascular: Negative for chest pain and palpitations.  Gastrointestinal: Negative for nausea, vomiting,  abdominal pain, diarrhea, constipation and blood in stool.  Genitourinary: Negative for dysuria and hematuria.  Musculoskeletal: Negative for neck pain and neck stiffness.  Skin: Negative for rash.  Neurological: Positive for weakness ( left sided secondary to previous stroke). Negative for dizziness, seizures, syncope, light-headedness, numbness and headaches.      Allergies  Review of patient's allergies indicates no known allergies.  Home Medications   Prior to Admission medications   Medication Sig Start Date End Date Taking? Authorizing Provider  Multiple Vitamin (MULTIVITAMIN WITH MINERALS) TABS tablet Take 1 tablet by mouth daily.   Yes Historical Provider, MD  nicotine (NICODERM CQ - DOSED IN MG/24 HR) 7 mg/24hr patch Place 1 patch (7 mg total) onto the skin daily. 07/26/15  Yes Ruben ImJeremy Ford, MD  nicotine polacrilex ([employer] NICOTINE POLACRILEX) 4 MG gum Take 1 each (4 mg total) by mouth as needed for smoking cessation. 07/26/15  Yes Ruben ImJeremy Ford, MD  amLODipine (NORVASC) 5 MG tablet Take 1 tablet (5 mg total) by mouth daily. 09/22/15   Chase PicketJaime Pilcher Ward, PA-C  aspirin 325 MG tablet Take 1 tablet (325 mg total) by mouth daily. Patient not taking: Reported on 09/15/2015 06/10/15   Rolly SalterPranav M Patel, MD  atorvastatin (LIPITOR) 80 MG tablet Take 1 tablet (80 mg total) by mouth daily. 09/22/15   Chase PicketJaime Pilcher Ward, PA-C  clopidogrel (PLAVIX) 75 MG tablet Take 1 tablet (75 mg total) by mouth daily. Patient not taking: Reported on 09/15/2015 06/10/15   Rolly SalterPranav M Patel,  MD  folic acid (FOLVITE) 1 MG tablet Take 1 tablet (1 mg total) by mouth daily. Patient not taking: Reported on 09/15/2015 06/10/15   Rolly Salter, MD  naltrexone (DEPADE) 50 MG tablet Take 1 tablet (50 mg total) by mouth daily. 07/27/15   Ruben Im, MD  naltrexone (DEPADE) 50 MG tablet Take 1 tablet (50 mg total) by mouth daily. 07/27/15   Ruben Im, MD  pantoprazole (PROTONIX) 40 MG tablet Take 1 tablet (40 mg total) by mouth daily.  09/22/15   Chase Picket Ward, PA-C  thiamine 100 MG tablet Take 1 tablet (100 mg total) by mouth daily. Patient not taking: Reported on 07/24/2015 06/10/15   Rolly Salter, MD   BP 151/84 mmHg  Pulse 54  Temp(Src) 97.8 F (36.6 C) (Oral)  Resp 16  Ht 6\' 1"  (1.854 m)  Wt 81.647 kg  BMI 23.75 kg/m2  SpO2 100% Physical Exam  Constitutional: He appears well-developed and well-nourished. No distress.  HENT:  Head: Normocephalic and atraumatic.  Mouth/Throat: Oropharynx is clear and moist. No oropharyngeal exudate.  Eyes: Conjunctivae and EOM are normal. Pupils are equal, round, and reactive to light. Right eye exhibits no discharge. Left eye exhibits no discharge. No scleral icterus.  Neck: Normal range of motion. Neck supple.  Cardiovascular: Normal rate, regular rhythm, normal heart sounds and intact distal pulses.   No murmur heard. Pulmonary/Chest: Effort normal and breath sounds normal. No respiratory distress.  Abdominal: Soft. Bowel sounds are normal. There is no tenderness. There is no rebound and no guarding.  Musculoskeletal: Normal range of motion.  Lymphadenopathy:    He has no cervical adenopathy.  Neurological: He is alert. He displays a negative Romberg sign. Coordination normal.  Mental Status:  Alert, thought content appropriate, able to give a coherent history. Speech fluent without evidence of aphasia. Able to follow 2 step commands without difficulty.  Cranial Nerves:  II:  Peripheral visual fields grossly normal, pupils equal, round, reactive to light III,IV, VI: ptosis not present, extra-ocular motions intact bilaterally  V,VII: smile asymmetric - left side secondary to previous CVA, facial light touch sensation equal VIII: hearing grossly normal to voice  X: uvula elevates symmetrically  XI: bilateral shoulder shrug symmetric and strong XII: midline tongue extension without fassiculations Motor:  Normal tone. Mild decrease in strength on left side, secondary to  previous CVA compared to right. Sensory: Pinprick and light touch normal in all extremities.  Cerebellar: normal finger-to-nose with bilateral upper extremities Gait: Patient is ambulatory without assistance. Mild ataxic gait; however, per pt this is baseline normal, review of problem list shows h/o ataxia.  CV: distal pulses palpable throughout     Skin: Skin is warm and dry. He is not diaphoretic.  Psychiatric: He has a normal mood and affect.    ED Course  Procedures (including critical care time) Labs Review Labs Reviewed  CBC - Abnormal; Notable for the following:    Hemoglobin 12.7 (*)    HCT 38.4 (*)    All other components within normal limits  I-STAT CHEM 8, ED - Abnormal; Notable for the following:    Chloride 99 (*)    All other components within normal limits  ETHANOL  PROTIME-INR  APTT  DIFFERENTIAL  COMPREHENSIVE METABOLIC PANEL  URINE RAPID DRUG SCREEN, HOSP PERFORMED  URINALYSIS, ROUTINE W REFLEX MICROSCOPIC (NOT AT [facility])  Rosezena Sensor, ED    Imaging Review Ct Head Wo Contrast  09/22/2015  CLINICAL DATA:  Ataxia.  History of  CVA. EXAM: CT HEAD WITHOUT CONTRAST TECHNIQUE: Contiguous axial images were obtained from the base of the skull through the vertex without intravenous contrast. COMPARISON:  07/24/2015 head CT. FINDINGS: Stable circumscribed subcutaneous 2.6 cm cystic structure in the right premaxillary cheek, most consistent with a sebaceous cyst. No evidence of parenchymal hemorrhage or extra-axial fluid collection. No mass lesion, mass effect, or midline shift. No CT evidence of acute infarction. Evolving encephalomalacia in the right frontal lobe. Stable chronic small bilateral basal ganglia and right thalamic infarcts. Generalized cerebral volume loss. Intracranial atherosclerosis. Nonspecific stable moderate subcortical and periventricular white matter hypodensity, most in keeping with chronic small vessel ischemic change. No ventriculomegaly. Mild  mucoperiosteal thickening in the bilateral ethmoidal air cells and visualized maxillary sinuses. No fluid levels in the visualized paranasal sinuses. The mastoid air cells are unopacified. No evidence of calvarial fracture. IMPRESSION: 1.  No evidence of acute intracranial abnormality. 2. Evolving right frontal lobe encephalomalacia. Stable chronic small bilateral basal ganglia and right thalamic infarcts. 3. Generalized cerebral volume loss and chronic moderate small vessel ischemia. 4. Mild bilateral paranasal sinusitis, probably acute. Electronically Signed   By: Delbert Phenix M.D.   On: 09/22/2015 21:03   I have personally reviewed and evaluated these images and lab results as part of my medical decision-making.   EKG Interpretation   Date/Time:  Saturday September 22 2015 21:32:45 EDT Ventricular Rate:  55 PR Interval:  178 QRS Duration: 78 QT Interval:  416 QTC Calculation: 397 R Axis:   14 Text Interpretation:  Sinus bradycardia Septal infarct , age undetermined  Abnormal ECG No significant change was found Confirmed by Manus Gunning  MD,  STEPHEN [phone]) on 09/22/2015 9:39:03 PM      MDM   Final diagnoses:  Essential hypertension   Patient is afebrile and non-toxic appearing. His blood pressure is mildly elevated. Physical exam remarkable for mild left sided waekness and mild ataxic gait. Per pt, this is baseline normal. Patient is negative romberg. His family states that his gait is more unsteady than usual. Will check CT head, CBC, APTT/PT, ethanol, troponin, CMP, and EKG r/o acute stroke vs. intracranial bleed. Patient discussed with Dr. Manus Gunning who also saw patient. Agrees with plan.   CMP re-assuring, CBC remarkable for mild decrease in hgb 12.7. Troponin negative. Ethanol normal. PT/APTT normal. EKG shows sinus bradycardia with no significant changes.   9:29 PM: Care signed over to [facility], PA-C. Pending normal CT, patient safe for discharge with Rx BP medications and close follow up  with PCP. Discussed with patient the importance of taking medications. Return precautions provided.   Lona Kettle, [place] 09/22/15 2142  Glynn Octave, MD 09/23/15 0200

## 2015-09-22 NOTE — ED Provider Notes (Signed)
[facility], case discussed, plan agreed upon. Will follow up on head CT and if negative, discharge to home. Will need refill of home HTN, HLD meds and PCP follow up.   CT head reviewed showing no acute intracranial abnormalities. Other findings discussed with attending. CT reassuring.  Refilled home meds - Information on how to find PCP discussed with patient. I stressed the importance of a PCP for overall health. Return precautions given and all questions answered.   [facility] Hospital[facility] Neal Jose Cloward, PA-C 09/22/15 2145  Glynn OctaveStephen Rancour, MD 09/23/15 0200

## 2015-09-22 NOTE — ED Notes (Signed)
The pts family just showed up  Asking questions  Apparently the pt just moved in with one of the females in the room.Marland Kitchen. i do not know who brought him to the hospital.  She asked me why he was here.  i told her to ask him  He told me that he had high bp and stopped taking his meds.

## 2015-09-22 NOTE — ED Notes (Signed)
The pt reports that he wants his bp checked he stopped taking his bp one week ago

## 2015-09-22 NOTE — ED Notes (Signed)
Pt disconnected himself from  All his wires  Reconnected to keep his bp checks

## 2015-09-22 NOTE — ED Notes (Signed)
Pt. Stated, No symptoms

## 2015-09-22 NOTE — ED Notes (Signed)
Pt not in the room

## 2015-09-29 ENCOUNTER — Emergency Department (HOSPITAL_COMMUNITY)
Admission: EM | Admit: 2015-09-29 | Discharge: 2015-09-29 | Discharge status: Against Medical Advice | Payer: Medicaid Other | Attending: Emergency Medicine | Admitting: Emergency Medicine

## 2015-09-29 ENCOUNTER — Encounter (HOSPITAL_COMMUNITY): Payer: Self-pay | Admitting: Family Medicine

## 2015-09-29 DIAGNOSIS — Z5321 Procedure and treatment not carried out due to patient leaving prior to being seen by health care provider: Secondary | ICD-10-CM | POA: Diagnosis not present

## 2015-09-29 DIAGNOSIS — F17210 Nicotine dependence, cigarettes, uncomplicated: Secondary | ICD-10-CM | POA: Insufficient documentation

## 2015-09-29 DIAGNOSIS — M79605 Pain in left leg: Secondary | ICD-10-CM | POA: Diagnosis not present

## 2015-09-29 DIAGNOSIS — Z7982 Long term (current) use of aspirin: Secondary | ICD-10-CM | POA: Insufficient documentation

## 2015-09-29 DIAGNOSIS — I10 Essential (primary) hypertension: Secondary | ICD-10-CM | POA: Diagnosis not present

## 2015-09-29 DIAGNOSIS — Z7902 Long term (current) use of antithrombotics/antiplatelets: Secondary | ICD-10-CM | POA: Insufficient documentation

## 2015-09-29 DIAGNOSIS — Z8673 Personal history of transient ischemic attack (TIA), and cerebral infarction without residual deficits: Secondary | ICD-10-CM | POA: Diagnosis not present

## 2015-09-29 DIAGNOSIS — M79604 Pain in right leg: Secondary | ICD-10-CM

## 2015-09-29 DIAGNOSIS — Z5329 Procedure and treatment not carried out because of patient's decision for other reasons: Secondary | ICD-10-CM

## 2015-09-29 DIAGNOSIS — Z5320 Procedure and treatment not carried out because of patient's decision for unspecified reasons: Secondary | ICD-10-CM

## 2015-09-29 NOTE — ED Notes (Signed)
Patient complains of bilateral leg pain x 3 weeks, denies trauma. Pain worse with ambulation

## 2015-09-29 NOTE — ED Notes (Signed)
Pt leaving against medical advice at this time. MD Clydene PughKnott aware. Pt family rude and disrespectful. Pt VSS. Ambulatory to wheelchair. When attempted to get patient to sign AMA form, pt wife states "Don't you sign that sh*t, they're probably gonna charge that to his medicaid and he did not get any treatment." pt reassured that form reports that you are leaving against the physicians medical advice and that you understand the risks of leaving. Pt family member waved her hand in this RN's face and wheeled patient out of the room and out of department.

## 2015-09-29 NOTE — ED Provider Notes (Signed)
CSN: [id number]     Arrival date & time 09/29/15  1426 History   First MD Initiated Contact with Patient 09/29/15 1726     Chief Complaint  Patient presents with  . bilateral leg pain      (Consider location/radiation/quality/duration/timing/severity/associated sxs/prior Treatment) Patient is a 60 y.o. male presenting with leg pain. The history is provided by the patient and a relative.  Leg Pain Location:  Leg Leg location:  L upper leg, R upper leg, R lower leg and L lower leg Pain details:    Quality:  Aching   Radiates to:  Does not radiate   Severity:  Moderate   Onset quality:  Gradual   Duration: unclear how long this has occured, told triage has been 3 weeks, now saying "been a minute" but unable to clarfy an onset.   Timing:  Constant   Progression:  Unchanged Prior injury to area:  No Relieved by:  Nothing Worsened by:  Activity Ineffective treatments:  Acetaminophen, NSAIDs and movement   Past Medical History  Diagnosis Date  . Hypertension   . Stroke [facility]) 06/07/2015    acute right MCA territory schematic infarctions/notes 06/07/2015   Past Surgical History  Procedure Laterality Date  . No past surgeries     Family History  Problem Relation Age of Onset  . Cancer Mother   . Diabetes Father   . Glaucoma Father   . Cancer Sister   . Glaucoma Other    Social History  Substance Use Topics  . Smoking status: Current Every Day Smoker -- 1.00 packs/day for 47 years    Types: Cigarettes  . Smokeless tobacco: Never Used  . Alcohol Use: 25.2 oz/week    42 Cans of beer per week     Comment: 06/08/2015 "6 pack of beer/day"    Review of Systems  All other systems reviewed and are negative.     Allergies  Review of patient's allergies indicates no known allergies.  Home Medications   Prior to Admission medications   Medication Sig Start Date End Date Taking? Authorizing Provider  amLODipine (NORVASC) 5 MG tablet Take 1 tablet (5 mg total) by mouth  daily. 09/22/15   Chase Picket Ward, PA-C  aspirin 325 MG tablet Take 1 tablet (325 mg total) by mouth daily. Patient not taking: Reported on 09/15/2015 06/10/15   Rolly Salter, MD  atorvastatin (LIPITOR) 80 MG tablet Take 1 tablet (80 mg total) by mouth daily. 09/22/15   Chase Picket Ward, PA-C  clopidogrel (PLAVIX) 75 MG tablet Take 1 tablet (75 mg total) by mouth daily. Patient not taking: Reported on 09/15/2015 06/10/15   Rolly Salter, MD  folic acid (FOLVITE) 1 MG tablet Take 1 tablet (1 mg total) by mouth daily. Patient not taking: Reported on 09/15/2015 06/10/15   Rolly Salter, MD  Multiple Vitamin (MULTIVITAMIN WITH MINERALS) TABS tablet Take 1 tablet by mouth daily.    Historical Provider, MD  naltrexone (DEPADE) 50 MG tablet Take 1 tablet (50 mg total) by mouth daily. 07/27/15   Ruben Im, MD  naltrexone (DEPADE) 50 MG tablet Take 1 tablet (50 mg total) by mouth daily. 07/27/15   Ruben Im, MD  nicotine (NICODERM CQ - DOSED IN MG/24 HR) 7 mg/24hr patch Place 1 patch (7 mg total) onto the skin daily. 07/26/15   Ruben Im, MD  nicotine polacrilex ([employer] NICOTINE POLACRILEX) 4 MG gum Take 1 each (4 mg total) by mouth as needed for smoking cessation. 07/26/15  Ruben ImJeremy Ford, MD  pantoprazole (PROTONIX) 40 MG tablet Take 1 tablet (40 mg total) by mouth daily. 09/22/15   Chase PicketJaime Pilcher Ward, PA-C  thiamine 100 MG tablet Take 1 tablet (100 mg total) by mouth daily. Patient not taking: Reported on 07/24/2015 06/10/15   Rolly SalterPranav M Patel, MD   BP 166/90 mmHg  Pulse 86  Temp(Src) 98.8 F (37.1 C) (Oral)  Resp 16  SpO2 99% Physical Exam  Constitutional: He is oriented to person, place, and time. He appears well-developed and well-nourished. No distress.  HENT:  Head: Normocephalic and atraumatic.  Eyes: Conjunctivae are normal.  Neck: Neck supple. No tracheal deviation present.  Cardiovascular: Normal rate and regular rhythm.   Pulmonary/Chest: Effort normal. No respiratory distress.  Abdominal:  Soft. He exhibits no distension.  Musculoskeletal:  No swelling or tenderness of legs bilaterally. Full ROM and ambulatory in room.   Neurological: He is alert and oriented to person, place, and time.  Skin: Skin is warm and dry.  Psychiatric: He has a normal mood and affect.    ED Course  Procedures (including critical care time) Labs Review Labs Reviewed  CBC WITH DIFFERENTIAL/PLATELET  BASIC METABOLIC PANEL  CK    Imaging Review No results found. I have personally reviewed and evaluated these images and lab results as part of my medical decision-making.   EKG Interpretation None      MDM   Final diagnoses:  Bilateral leg pain  Left against medical advice    60 y.o. male presents with Bilateral lower extremity pain. History is provided by the daughter he says he has had some progressive leg pain and it is difficult to walk and because of this. I recommended screening for rhabdomyolysis with blood work today because of history of statin use. The patient's daughter is requesting a refill of tramadol and when I explained I would not be refilling a controlled medication from the ED because of history of substance abuse issues and lack of appropriate follow-up his daughter said "put on your shoes, they said they're not going to do anything for you, we will come back with the ambulance later tonight and see another doctor." I explained to the patient and the daughter that he will be leaving AGAINST MEDICAL ADVICE because an appropriate screening exam for rhabdo was not completed as they would not comply with blood work. They understood the risks of leaving, the patient appears clinically sober and capable of making decisions currently.  I recommended patient come back at any time for screening of his CK to rule out rhabdomyolysis or complete workup for other emergent medical condition.   Lyndal Pulleyaniel Niaya Hickok, MD 09/30/15 [id number]

## 2016-01-29 ENCOUNTER — Emergency Department (HOSPITAL_COMMUNITY): Payer: Medicaid Other

## 2016-01-29 ENCOUNTER — Encounter (HOSPITAL_COMMUNITY): Payer: Self-pay

## 2016-01-29 ENCOUNTER — Emergency Department (HOSPITAL_COMMUNITY)
Admission: EM | Admit: 2016-01-29 | Discharge: 2016-01-29 | Disposition: A | Discharge status: Home / Self Care | Payer: Medicaid Other | Attending: Emergency Medicine | Admitting: Emergency Medicine

## 2016-01-29 DIAGNOSIS — R4182 Altered mental status, unspecified: Secondary | ICD-10-CM | POA: Diagnosis present

## 2016-01-29 DIAGNOSIS — Z5321 Procedure and treatment not carried out due to patient leaving prior to being seen by health care provider: Secondary | ICD-10-CM | POA: Diagnosis not present

## 2016-01-29 LAB — COMPREHENSIVE METABOLIC PANEL
ALBUMIN: 3.9 g/dL (ref 3.5–5.0)
ALK PHOS: 102 U/L (ref 38–126)
ALT: 13 U/L — AB (ref 17–63)
AST: 19 U/L (ref 15–41)
Anion gap: 8 (ref 5–15)
BILIRUBIN TOTAL: 0.7 mg/dL (ref 0.3–1.2)
BUN: 13 mg/dL (ref 6–20)
CO2: 26 mmol/L (ref 22–32)
CREATININE: 1.1 mg/dL (ref 0.61–1.24)
Calcium: 9.1 mg/dL (ref 8.9–10.3)
Chloride: 107 mmol/L (ref 101–111)
GFR calc Af Amer: >60 mL/min (ref >60)
GLUCOSE: 91 mg/dL (ref 65–99)
Potassium: 4 mmol/L (ref 3.5–5.1)
Sodium: 141 mmol/L (ref 135–145)
TOTAL PROTEIN: 7.5 g/dL (ref 6.5–8.1)

## 2016-01-29 LAB — CBC
HCT: 42.7 % (ref 39.0–52.0)
Hemoglobin: 14.6 g/dL (ref 13.0–17.0)
MCH: 30.5 pg (ref 26.0–34.0)
MCHC: 34.2 g/dL (ref 30.0–36.0)
MCV: 89.1 fL (ref 78.0–100.0)
PLATELETS: 272 10*3/uL (ref 150–400)
RBC: 4.79 MIL/uL (ref 4.22–5.81)
RDW: 13.9 % (ref 11.5–15.5)
WBC: 11.3 10*3/uL — AB (ref 4.0–10.5)

## 2016-01-29 LAB — CBG MONITORING, ED: Glucose-Capillary: 65 mg/dL (ref 65–99)

## 2016-01-29 NOTE — ED Notes (Signed)
Pt moved back to room and unable to locate in waiting room.

## 2016-01-29 NOTE — ED Triage Notes (Signed)
Patient here with granddaughter who reports several months of incontinence of bowel and bladder and failure to thrive. Alert to person burt confused to day of the week. Patient denies pain and ambulated without difficulty. No obvious neuro deficits. Spouse coming to ED to provide additional hx

## 2016-01-29 NOTE — ED Notes (Signed)
Unable to locate in waiting room

## 2016-03-07 ENCOUNTER — Emergency Department (HOSPITAL_COMMUNITY)
Admission: EM | Admit: 2016-03-07 | Discharge: 2016-03-07 | Disposition: A | Discharge status: Home / Self Care | Payer: Medicaid Other | Attending: Emergency Medicine | Admitting: Emergency Medicine

## 2016-03-07 DIAGNOSIS — F1910 Other psychoactive substance abuse, uncomplicated: Secondary | ICD-10-CM | POA: Diagnosis not present

## 2016-03-07 DIAGNOSIS — I10 Essential (primary) hypertension: Secondary | ICD-10-CM | POA: Insufficient documentation

## 2016-03-07 DIAGNOSIS — Z7982 Long term (current) use of aspirin: Secondary | ICD-10-CM | POA: Insufficient documentation

## 2016-03-07 DIAGNOSIS — F1010 Alcohol abuse, uncomplicated: Secondary | ICD-10-CM | POA: Diagnosis present

## 2016-03-07 DIAGNOSIS — Z8673 Personal history of transient ischemic attack (TIA), and cerebral infarction without residual deficits: Secondary | ICD-10-CM | POA: Diagnosis not present

## 2016-03-07 DIAGNOSIS — Z79899 Other long term (current) drug therapy: Secondary | ICD-10-CM | POA: Diagnosis not present

## 2016-03-07 DIAGNOSIS — F17210 Nicotine dependence, cigarettes, uncomplicated: Secondary | ICD-10-CM | POA: Diagnosis not present

## 2016-03-07 MED ORDER — AMLODIPINE BESYLATE 5 MG PO TABS: 5.0000 mg | ORAL_TABLET | Freq: Every day | ORAL | 0 refills | Status: DC

## 2016-03-07 NOTE — Progress Notes (Signed)
CSW provided patient with list of substance abuse resources in the area.   Stacy GardnerErin Lenon Kuennen, LCSWA Clinical Social Worker [phone]

## 2016-03-07 NOTE — ED Provider Notes (Signed)
[facility]-EMERGENCY DEPT Provider Note   CSN: [id number] Arrival date & time: 03/07/16  2113     History   Chief Complaint Chief Complaint  Patient presents with  . Mental Health Eval    HPI Jose Neal is a 60 y.o. male.  HPI 60 year old gentleman with history of alcohol abuse and polysubstance abuse as well as hypertension was brought in by his daughter for assistance with possible rehabilitation. She reports that the patient has gone through rehabilitation several times however has not been successful. She's been trying to get [facility] but again has been unsuccessful. Patient denies any SI, HI, AVH.  Patient does have a history of hypertension that is uncontrolled. He has not been taking his medicine for approximately one month. Denies any current headache, chest pain, shortness of breath, dizziness, leg swelling, difficulty urinating.   Past Medical History:  Diagnosis Date  . Hypertension   . Stroke [facility](HCC) 06/07/2015   acute right MCA territory schematic infarctions/notes 06/07/2015    Patient Active Problem List   Diagnosis Date Noted  . Cerebrovascular accident (CVA) (HCC)   . Tobacco use disorder   . Alcohol abuse   . HLD (hyperlipidemia)   . Essential hypertension   . History of stroke   . Stroke (HCC) 07/24/2015  . 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 ETOH abuse 06/08/2015  . Hypertension, essential 06/08/2015  . Acute CVA (cerebrovascular accident) (HCC) 06/08/2015    Past Surgical History:  Procedure Laterality Date  . NO PAST SURGERIES         Home Medications    Prior to Admission medications   Medication Sig Start Date End Date Taking? Authorizing Provider  atorvastatin (LIPITOR) 80 MG tablet Take 1 tablet (80 mg total) by mouth daily. 09/22/15  Yes Jaime Pilcher Ward, PA-C  nicotine (NICODERM CQ - DOSED IN MG/24 HR) 7 mg/24hr patch Place 1 patch (7 mg total) onto the skin daily. 07/26/15  Yes Ruben ImJeremy Ford, MD  pantoprazole (PROTONIX) 40 MG  tablet Take 1 tablet (40 mg total) by mouth daily. 09/22/15  Yes Jaime Pilcher Ward, PA-C  amLODipine (NORVASC) 5 MG tablet Take 1 tablet (5 mg total) by mouth daily. 03/07/16 04/06/16  Nira ConnPedro Eduardo Cardama, MD  aspirin 325 MG tablet Take 1 tablet (325 mg total) by mouth daily. Patient not taking: Reported on 09/15/2015 06/10/15   Rolly SalterPranav M Patel, MD  clopidogrel (PLAVIX) 75 MG tablet Take 1 tablet (75 mg total) by mouth daily. Patient not taking: Reported on 09/15/2015 06/10/15   Rolly SalterPranav M Patel, MD  folic acid (FOLVITE) 1 MG tablet Take 1 tablet (1 mg total) by mouth daily. Patient not taking: Reported on 09/15/2015 06/10/15   Rolly SalterPranav M Patel, MD  naltrexone (DEPADE) 50 MG tablet Take 1 tablet (50 mg total) by mouth daily. Patient not taking: Reported on 03/07/2016 07/27/15   Ruben ImJeremy Ford, MD  naltrexone (DEPADE) 50 MG tablet Take 1 tablet (50 mg total) by mouth daily. Patient not taking: Reported on 03/07/2016 07/27/15   Ruben ImJeremy Ford, MD  nicotine polacrilex ([employer] NICOTINE POLACRILEX) 4 MG gum Take 1 each (4 mg total) by mouth as needed for smoking cessation. Patient not taking: Reported on 03/07/2016 07/26/15   Ruben ImJeremy Ford, MD  thiamine 100 MG tablet Take 1 tablet (100 mg total) by mouth daily. Patient not taking: Reported on 07/24/2015 06/10/15   Rolly SalterPranav M Patel, MD    Family History Family History  Problem Relation Age of Onset  . Cancer  Mother   . Diabetes Father   . Glaucoma Father   . Cancer Sister   . Glaucoma Other     Social History Social History  Substance Use Topics  . Smoking status: Current Every Day Smoker    Packs/day: 1.00    Years: 47.00    Types: Cigarettes  . Smokeless tobacco: Never Used  . Alcohol use 25.2 oz/week    42 Cans of beer per week     Comment: 06/08/2015 "6 pack of beer/day"     Allergies   Patient has no known allergies.   Review of Systems Review of Systems Ten systems are reviewed and are negative for acute change except as noted in the  HPI   Physical Exam Updated Vital Signs BP 179/84 (BP Location: Right Arm)   Pulse 82   Temp 98.1 F (36.7 C) (Oral)   Resp 14   SpO2 100%   Physical Exam  Constitutional: He is oriented to person, place, and time. He appears well-developed and well-nourished. No distress.  HENT:  Head: Normocephalic and atraumatic.  Nose: Nose normal.  Eyes: Conjunctivae and EOM are normal. Pupils are equal, round, and reactive to light. Right eye exhibits no discharge. Left eye exhibits no discharge. No scleral icterus.  Neck: Normal range of motion. Neck supple.  Cardiovascular: Normal rate and regular rhythm.  Exam reveals no gallop and no friction rub.   No murmur heard. Pulmonary/Chest: Effort normal and breath sounds normal. No stridor. No respiratory distress. He has no rales.  Abdominal: Soft. He exhibits no distension. There is no tenderness.  Musculoskeletal: He exhibits no edema or tenderness.  Neurological: He is alert and oriented to person, place, and time.  Skin: Skin is warm and dry. No rash noted. He is not diaphoretic. No erythema.  Psychiatric: He has a normal mood and affect.  Vitals reviewed.    ED Treatments / Results  Labs (all labs ordered are listed, but only abnormal results are displayed) Labs Reviewed - No data to display  EKG  EKG Interpretation None       Radiology No results found.  Procedures Procedures (including critical care time)  Medications Ordered in ED Medications - No data to display   Initial Impression / Assessment and Plan / ED Course  I have reviewed the triage vital signs and the nursing notes.  Pertinent labs & imaging results that were available during my care of the patient were reviewed by me and considered in my medical decision making (see chart for details).  Clinical Course     No active SI. Consultation TTS and social work for assistance with that again system. Once daughter realized that the patient was noted to be  admitted she had the patient requested to be discharged. Social worker did arrive prior to this and will follow-up with the patient in order to provide assistance with numbing in the system regarding rehabilitation needs. Patient will be provided with prescription for his home dose of amlodipine.  Safe for discharge with strict return precautions.  Final Clinical Impressions(s) / ED Diagnoses   Final diagnoses:  Alcohol abuse  Polysubstance abuse  Hypertension, unspecified type   Disposition: Discharge  Condition: Good  I have discussed the results, Dx and Tx plan with the patient who expressed understanding and agree(s) with the plan. Discharge instructions discussed at great length. The patient was given strict return precautions who verbalized understanding of the instructions. No further questions at time of discharge.  Discharge Medication List as of 03/07/2016 10:38 PM        Nira Conn, MD 03/08/16 [phone]

## 2016-03-07 NOTE — ED Notes (Addendum)
Pt is presented by daughter who states that his father "needs mental health evaluation because he has self neglected and is not taking his blood pressure medications." Pt denies suicidal or homicidal thoughts.

## 2016-03-07 NOTE — ED Notes (Signed)
Pt's daughter states they need to leave. Adds that her dad will not have anywhere to sleep after 11pm. I have communicated this to the attending EDP who is en route to talk to her.

## 2016-03-20 ENCOUNTER — Encounter (HOSPITAL_COMMUNITY): Payer: Self-pay | Admitting: Emergency Medicine

## 2016-03-20 ENCOUNTER — Emergency Department (HOSPITAL_COMMUNITY)
Admission: EM | Admit: 2016-03-20 | Discharge: 2016-03-21 | Disposition: A | Discharge status: Home / Self Care | Payer: Medicaid Other | Attending: Emergency Medicine | Admitting: Emergency Medicine

## 2016-03-20 DIAGNOSIS — I10 Essential (primary) hypertension: Secondary | ICD-10-CM | POA: Insufficient documentation

## 2016-03-20 DIAGNOSIS — Z7982 Long term (current) use of aspirin: Secondary | ICD-10-CM | POA: Insufficient documentation

## 2016-03-20 DIAGNOSIS — Z79899 Other long term (current) drug therapy: Secondary | ICD-10-CM | POA: Insufficient documentation

## 2016-03-20 DIAGNOSIS — F17210 Nicotine dependence, cigarettes, uncomplicated: Secondary | ICD-10-CM | POA: Insufficient documentation

## 2016-03-20 DIAGNOSIS — Z8673 Personal history of transient ischemic attack (TIA), and cerebral infarction without residual deficits: Secondary | ICD-10-CM | POA: Insufficient documentation

## 2016-03-20 MED ORDER — HYDROCHLOROTHIAZIDE 25 MG PO TABS: 25.0000 mg | ORAL_TABLET | Freq: Every day | ORAL | 0 refills | Status: AC

## 2016-03-20 MED ORDER — AMLODIPINE BESYLATE 5 MG PO TABS
10.0000 mg | ORAL_TABLET | Freq: Once | ORAL | Status: AC
  Administered 2016-03-20: 10 mg via ORAL
  Filled 2016-03-20: qty 2

## 2016-03-20 MED ORDER — AMLODIPINE BESYLATE 10 MG PO TABS: 10.0000 mg | ORAL_TABLET | Freq: Every day | ORAL | 0 refills | Status: AC

## 2016-03-20 MED ORDER — HYDROCHLOROTHIAZIDE 25 MG PO TABS
25.0000 mg | ORAL_TABLET | Freq: Once | ORAL | Status: AC
  Administered 2016-03-20: 25 mg via ORAL
  Filled 2016-03-20: qty 1

## 2016-03-20 MED ORDER — SODIUM CHLORIDE 0.9 % IV BOLUS (SEPSIS)
1000.0000 mL | Freq: Once | INTRAVENOUS | Status: AC
  Administered 2016-03-20: 1000 mL via INTRAVENOUS

## 2016-03-20 NOTE — ED Triage Notes (Signed)
Pt presents to the ED via [facility] with complaints of difficulty ambulating, dizziness and "just don't feel right." Has had increased continence for the past two days with dark urine. Noncompliance with htn medications, has not taken it in the past month. EMS reported a BP of 216/112 in the R arm, and 192/62 in the L arm. Upon arrival BP of 186/94.

## 2016-03-20 NOTE — ED Provider Notes (Signed)
[facility]-EMERGENCY DEPT Provider Note   CSN: [id number] Arrival date & time: 03/20/16  2249  By signing my name below, I, Jose Neal, attest that this documentation has been prepared under the direction and in the presence of Jose Crumble, MD. Electronically Signed: Doreatha Neal, ED Scribe. 03/20/16. 11:20 PM.     History   Chief Complaint Chief Complaint  Patient presents with  . Hypertension    HPI Jose Neal is a 60 y.o. male brought in by ambulance with h/o HTN on Lisinopril (has not taken in a month) who presents to the Emergency Department complaining of moderate dizziness that began tonight. Pt also reports his BP was found to be elevated by EMS. No worsening or alleviating factors noted. Pt states he has not seen his PCP recently for a check up. He denies CP, weakness, numbness, increased SOB from baseline.   The history is provided by the patient. No language interpreter was used.    Past Medical History:  Diagnosis Date  . Hypertension   . Stroke [facility]) 06/07/2015   acute right MCA territory schematic infarctions/notes 06/07/2015    Patient Active Problem List   Diagnosis Date Noted  . Cerebrovascular accident (CVA) (HCC)   . Tobacco use disorder   . Alcohol abuse   . HLD (hyperlipidemia)   . Essential hypertension   . History of stroke   . Stroke (HCC) 07/24/2015  . 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 ETOH abuse 06/08/2015  . Hypertension, essential 06/08/2015  . Acute CVA (cerebrovascular accident) (HCC) 06/08/2015    Past Surgical History:  Procedure Laterality Date  . NO PAST SURGERIES         Home Medications    Prior to Admission medications   Medication Sig Start Date End Date Taking? Authorizing Provider  amLODipine (NORVASC) 10 MG tablet Take 1 tablet (10 mg total) by mouth daily. 03/20/16   Jose Crumble, MD  aspirin 325 MG tablet Take 1 tablet (325 mg total) by mouth daily. Patient not taking: Reported on 09/15/2015 06/10/15   Jose Salter, MD  atorvastatin  (LIPITOR) 80 MG tablet Take 1 tablet (80 mg total) by mouth daily. 09/22/15   Jose Picket Ward, PA-C  clopidogrel (PLAVIX) 75 MG tablet Take 1 tablet (75 mg total) by mouth daily. Patient not taking: Reported on 09/15/2015 06/10/15   Jose Salter, MD  folic acid (FOLVITE) 1 MG tablet Take 1 tablet (1 mg total) by mouth daily. Patient not taking: Reported on 09/15/2015 06/10/15   Jose Salter, MD  hydrochlorothiazide (HYDRODIURIL) 25 MG tablet Take 1 tablet (25 mg total) by mouth daily. 03/20/16   Jose Crumble, MD  naltrexone (DEPADE) 50 MG tablet Take 1 tablet (50 mg total) by mouth daily. Patient not taking: Reported on 03/07/2016 07/27/15   Jose Im, MD  naltrexone (DEPADE) 50 MG tablet Take 1 tablet (50 mg total) by mouth daily. Patient not taking: Reported on 03/07/2016 07/27/15   Jose Im, MD  nicotine (NICODERM CQ - DOSED IN MG/24 HR) 7 mg/24hr patch Place 1 patch (7 mg total) onto the skin daily. 07/26/15   Jose Im, MD  nicotine polacrilex ([employer] NICOTINE POLACRILEX) 4 MG gum Take 1 each (4 mg total) by mouth as needed for smoking cessation. Patient not taking: Reported on 03/07/2016 07/26/15   Jose Im, MD  pantoprazole (PROTONIX) 40 MG tablet Take 1 tablet (40 mg total) by mouth daily. 09/22/15   Jose Picket Ward, PA-C  thiamine 100 MG tablet Take 1  tablet (100 mg total) by mouth daily. Patient not taking: Reported on 07/24/2015 06/10/15   Jose SalterPranav M Patel, MD    Family History Family History  Problem Relation Age of Onset  . Cancer Mother   . Diabetes Father   . Glaucoma Father   . Cancer Sister   . Glaucoma Other     Social History Social History  Substance Use Topics  . Smoking status: Current Every Day Smoker    Packs/day: 1.00    Years: 47.00    Types: Cigarettes  . Smokeless tobacco: Never Used  . Alcohol use 25.2 oz/week    42 Cans of beer per week     Comment: 06/08/2015 "6 pack of beer/day"     Allergies   Patient has no known allergies.   Review of  Systems Review of Systems A complete 10 system review of systems was obtained and all systems are negative except as noted in the HPI and PMH.    Physical Exam Updated Vital Signs BP (!) 162/107   Pulse 69   Temp 98.2 F (36.8 C) (Oral)   Resp 22   Ht 6\' 1"  (1.854 m)   Wt 180 lb (81.6 kg)   SpO2 100%   BMI 23.75 kg/m   Physical Exam  Constitutional: He is oriented to person, place, and time. Vital signs are normal. He appears well-developed and well-nourished.  Non-toxic appearance. He does not appear ill. No distress.  HENT:  Head: Normocephalic and atraumatic.  Nose: Nose normal.  Mouth/Throat: Oropharynx is clear and moist. No oropharyngeal exudate.  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   Eyes: Conjunctivae and EOM are normal. Pupils are equal, round, and reactive to light. No scleral icterus.  Neck: Normal range of motion. Neck supple. No tracheal deviation, no edema, no erythema and normal range of motion present. No thyroid mass and no thyromegaly present.  Cardiovascular: Normal rate, regular rhythm, S1 normal, S2 normal, normal heart sounds, intact distal pulses and normal pulses.  Exam reveals no gallop and no friction rub.   No murmur heard. Pulmonary/Chest: Effort normal and breath sounds normal. No respiratory distress. He has no wheezes. He has no rhonchi. He has no rales.  Abdominal: Soft. Normal appearance and bowel sounds are normal. He exhibits no distension, no ascites and no mass. There is no hepatosplenomegaly. There is no tenderness. There is no rebound, no guarding and no CVA tenderness.  Musculoskeletal: Normal range of motion. He exhibits no edema or tenderness.  Lymphadenopathy:    He has no cervical adenopathy.  Neurological: He is alert and oriented to person, place, and time. He has normal strength. No cranial nerve deficit or sensory deficit.  Normal strength and sensation in all extremities. Normal cerebellar testing.   Skin: Skin is warm, dry and intact.  No petechiae and no rash noted. He is not diaphoretic. No erythema. No pallor.  Nursing note and vitals reviewed.    ED Treatments / Results   DIAGNOSTIC STUDIES: Oxygen Saturation is 98% on RA, normal by my interpretation.    COORDINATION OF CARE: 11:12 PM Discussed treatment plan with pt at bedside which includes lab work and pt agreed to plan.    Labs (all labs ordered are listed, but only abnormal results are displayed) Labs Reviewed  CBC WITH DIFFERENTIAL/PLATELET - Abnormal; Notable for the following:       Result Value   RDW 16.2 (*)    All other components within normal limits  BASIC METABOLIC  PANEL    EKG  EKG Interpretation  Date/Time:  Thursday March 20 2016 23:39:24 EST Ventricular Rate:  60 PR Interval:    QRS Duration: 87 QT Interval:  435 QTC Calculation: 435 R Axis:   24 Text Interpretation:  Sinus rhythm Anteroseptal infarct, old Tall T, consider metabolic/ischemic abnrm No significant change since last tracing Confirmed by Erroll Lunani, Rosamond Andress Ayokunle [phone]([id number]) on 03/20/2016 11:49:06 PM       Radiology No results found.  Procedures Procedures (including critical care time)  Medications Ordered in ED Medications  sodium chloride 0.9 % bolus 1,000 mL (1,000 mLs Intravenous New Bag/Given 03/20/16 2355)  amLODipine (NORVASC) tablet 10 mg (10 mg Oral Given 03/20/16 2354)  hydrochlorothiazide (HYDRODIURIL) tablet 25 mg (25 mg Oral Given 03/20/16 2355)     Initial Impression / Assessment and Plan / ED Course  I have reviewed the triage vital signs and the nursing notes.  Pertinent labs & imaging results that were available during my care of the patient were reviewed by me and considered in my medical decision making (see chart for details).  Clinical Course    Patient presents to the ED for HTN and dizziness. Patient was not concerned but his daughter called 911.  Currently he is hypertensive to 180s systolic.  Will start him on 2 medications, amlodpine  and HCTZ at max dose.  EKG shows possible peaked t waves, will obtain BMP for evaluation.  No signs of hypertensive emergency at this time.  Neuro exam is normal.     12:19 AM Labs are normal, no hyper K and comparing EKGs, the T wave peaking is similar to prior.  He was given IVF as well.  BP down to 160 systolic.  PCP fu advised within 3 days for close follow up and BP check. He demonstrates good understanding. He appears well and in NAD. Vs remain within his normal limits and he is safe for DC.  Final Clinical Impressions(s) / ED Diagnoses   Final diagnoses:  Hypertension, unspecified type    New Prescriptions New Prescriptions   AMLODIPINE (NORVASC) 10 MG TABLET    Take 1 tablet (10 mg total) by mouth daily.   HYDROCHLOROTHIAZIDE (HYDRODIURIL) 25 MG TABLET    Take 1 tablet (25 mg total) by mouth daily.      I personally performed the services described in this documentation, which was scribed in my presence. The recorded information has been reviewed and is accurate.      Jose CrumbleAdeleke Rexton Greulich, MD 03/21/16 0020

## 2016-03-21 LAB — BASIC METABOLIC PANEL
ANION GAP: 9 (ref 5–15)
BUN: 12 mg/dL (ref 6–20)
CO2: 22 mmol/L (ref 22–32)
Calcium: 9 mg/dL (ref 8.9–10.3)
Chloride: 107 mmol/L (ref 101–111)
Creatinine, Ser: 1.04 mg/dL (ref 0.61–1.24)
Glucose, Bld: 74 mg/dL (ref 65–99)
POTASSIUM: 4.3 mmol/L (ref 3.5–5.1)
SODIUM: 138 mmol/L (ref 135–145)

## 2016-03-21 LAB — CBC WITH DIFFERENTIAL/PLATELET
BASOS PCT: 0 %
Basophils Absolute: 0 10*3/uL (ref 0.0–0.1)
EOS PCT: 1 %
Eosinophils Absolute: 0.1 10*3/uL (ref 0.0–0.7)
HCT: 40.1 % (ref 39.0–52.0)
Hemoglobin: 13.2 g/dL (ref 13.0–17.0)
LYMPHS ABS: 3.4 10*3/uL (ref 0.7–4.0)
Lymphocytes Relative: 36 %
MCH: 30.1 pg (ref 26.0–34.0)
MCHC: 32.9 g/dL (ref 30.0–36.0)
MCV: 91.6 fL (ref 78.0–100.0)
MONOS PCT: 8 %
Monocytes Absolute: 0.8 10*3/uL (ref 0.1–1.0)
NEUTROS ABS: 5.2 10*3/uL (ref 1.7–7.7)
Neutrophils Relative %: 55 %
Platelets: 322 10*3/uL (ref 150–400)
RBC: 4.38 MIL/uL (ref 4.22–5.81)
RDW: 16.2 % — AB (ref 11.5–15.5)
WBC: 9.5 10*3/uL (ref 4.0–10.5)

## 2016-04-18 ENCOUNTER — Encounter: Payer: Self-pay | Admitting: Pediatric Intensive Care

## 2016-05-29 NOTE — Congregational Nurse Program (Signed)
Congregational Nurse Program Note  Date of Encounter: 04/18/2016  Past Medical History: Past Medical History:  Diagnosis Date  . Hypertension   . Stroke [facility]) 06/07/2015   acute right MCA territory schematic infarctions/notes 06/07/2015    Encounter Details:  BP check

## 2016-06-09 IMAGING — MR MR MRA HEAD W/O CM
10 of 13 series · 29 of 48 positions shown · IV contrast (Yes)
Comparison: Head CT earlier today.  Brain MRI 06/08/2015.

CLINICAL DATA: Unwitnessed fall. Slurred speech and diminished
left-sided strength.

EXAM:
MRI HEAD WITHOUT CONTRAST
MRA HEAD WITHOUT CONTRAST
TECHNIQUE: Multiplanar, multiecho pulse sequences of the brain and surrounding
structures were obtained without intravenous contrast. Angiographic
images of the head were obtained using MRA technique without
contrast.

[Series 2: FLAIR · sagittal · 5.0mm · 0.47mm/px · 1 of 25 slices shown (1 of 3)]
[im 1/25]
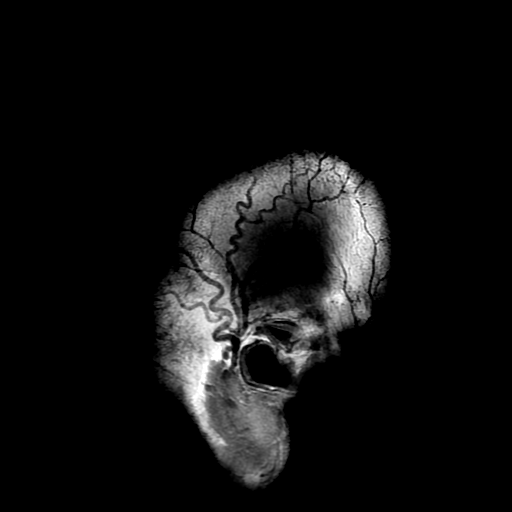

[Series 4: DWI · axial · 3.6mm · 0.94mm/px · z∈[-105,+56]mm · 6 of 92 slices shown (1 of 4)]
[im 1/92]
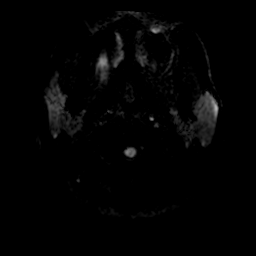
[im 19/92]
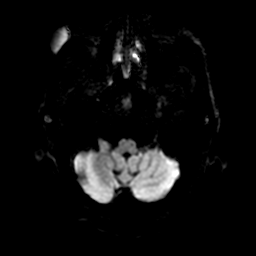
[im 37/92]
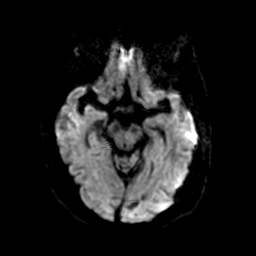
[im 55/92]
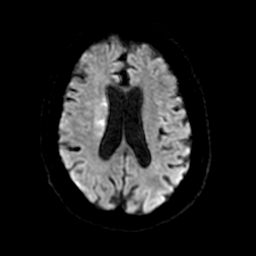
[im 73/92]
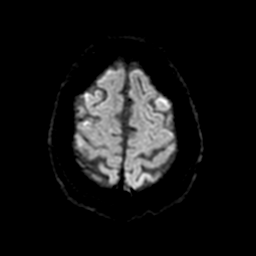
[im 92/92]
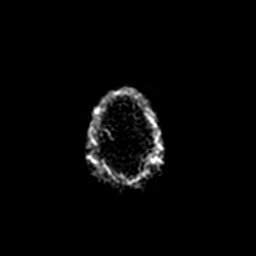

[Series 5: FLAIR · axial · 5.0mm · 0.47mm/px · z∈[-99,+56]mm · 2 of 27 slices shown (2 of 3)]
[im 1/27]
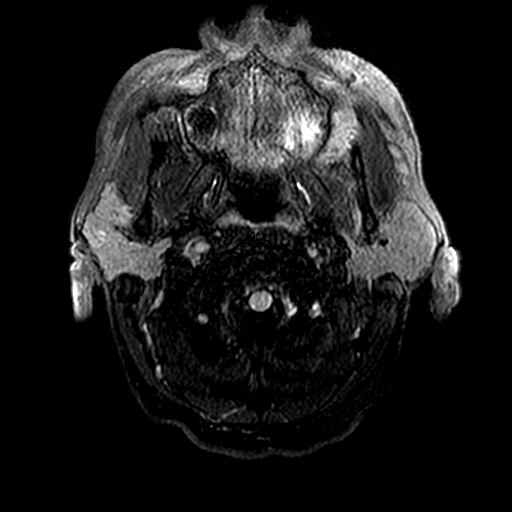
[im 27/27]
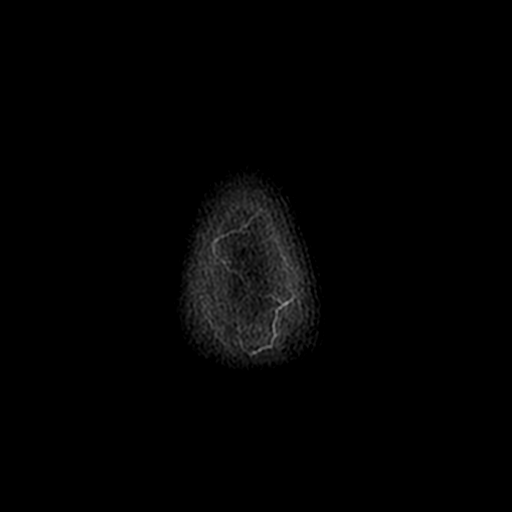

[Series 6: ax (id) 2 · axial · 1.2mm · 0.43mm/px · z∈[-108,-55]mm · 4 of 200 slices shown]
[im 1/200]
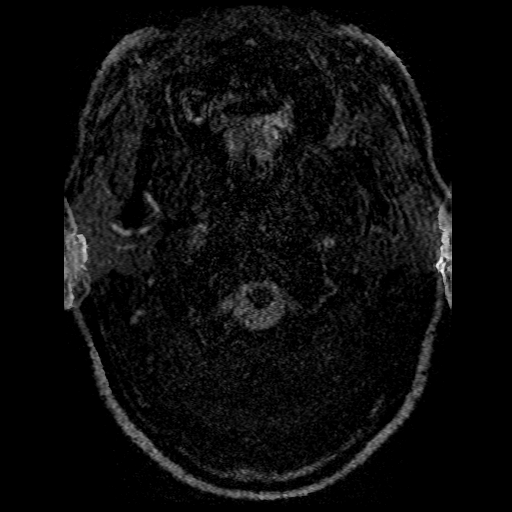
[im 37/200]
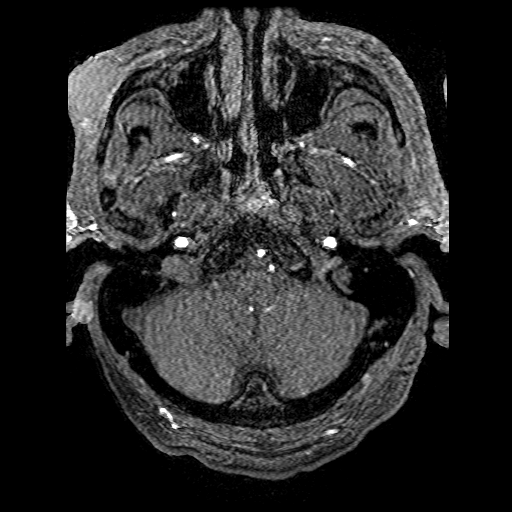
[im 55/200]
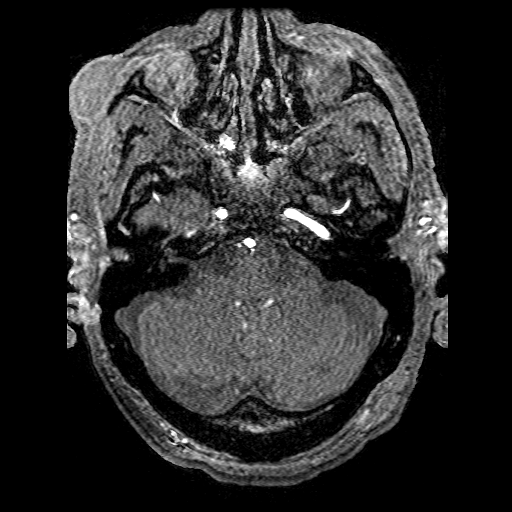
[im 91/200]
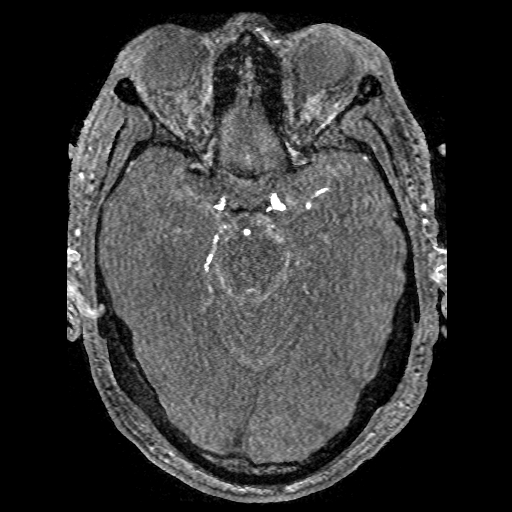

[Series 7: T2 · axial · 5.0mm · 0.47mm/px · z∈[-99,+56]mm · 2 of 27 slices shown (1 of 2)]
[im 1/27]
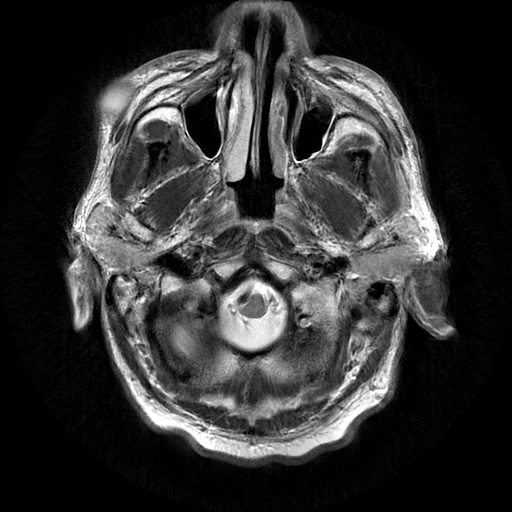
[im 27/27]
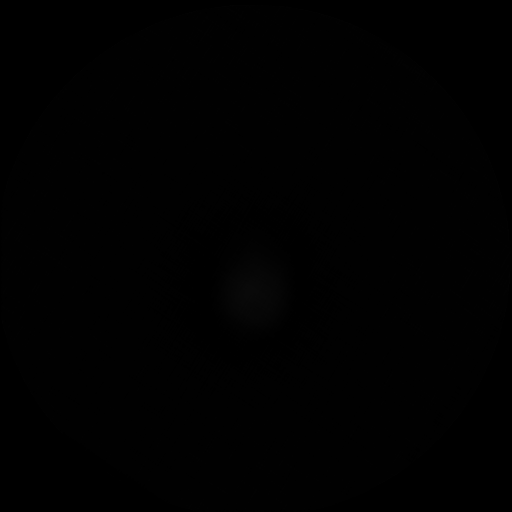

[Series 8: FLAIR · axial · 5.0mm · 0.47mm/px · z∈[-99,+56]mm · 2 of 27 slices shown (3 of 3)]
[im 1/27]
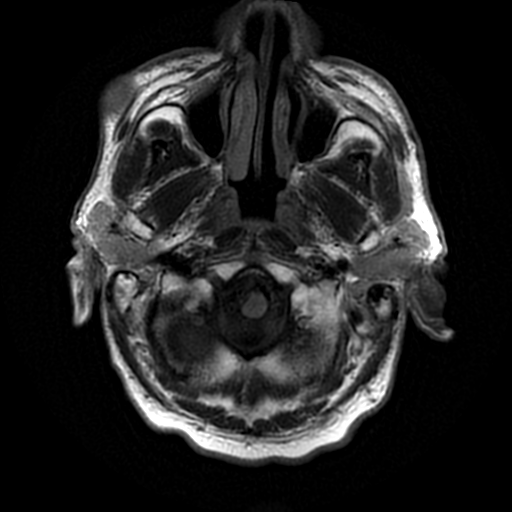
[im 27/27]
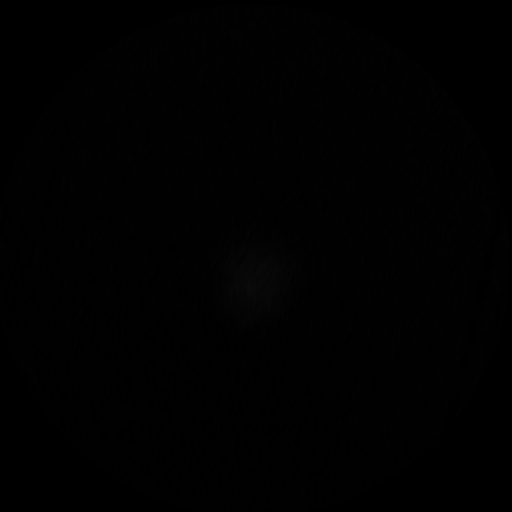

[Series 9: DWI · coronal · 5.0mm · 0.94mm/px · 5 of 78 slices shown (2 of 4)]
[im 1/78]
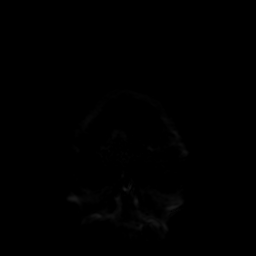
[im 20/78]
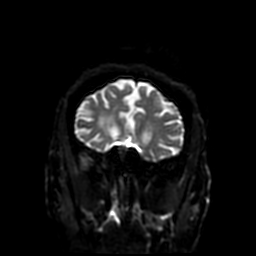
[im 39/78]
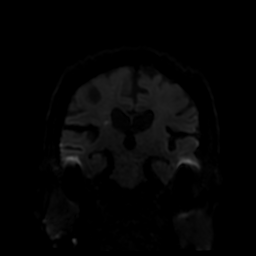
[im 58/78]
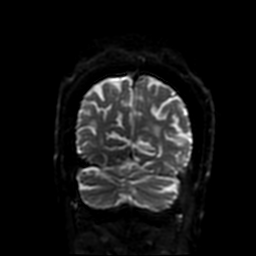
[im 78/78]
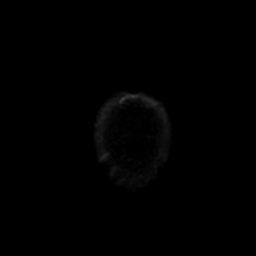

[Series 12: T2 · coronal · 5.0mm · 0.39mm/px · 2 of 33 slices shown (2 of 2)]
[im 1/33]
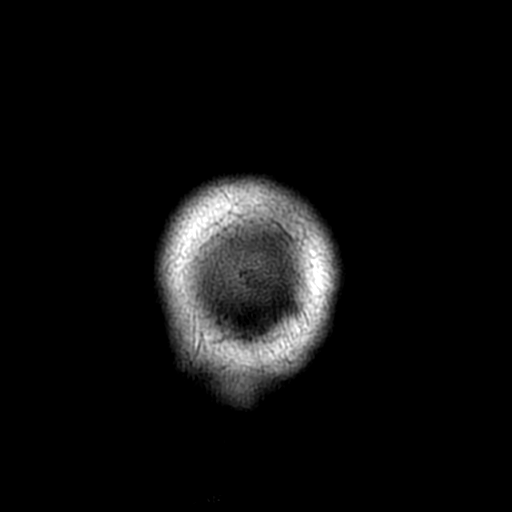
[im 33/33]
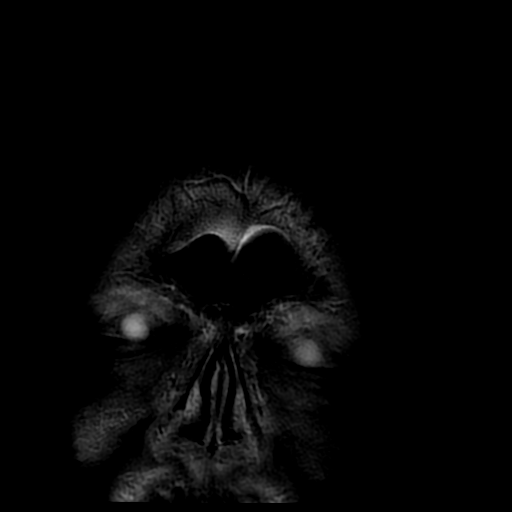

[Series 400: DWI · axial · 3.6mm · 0.94mm/px · z∈[-105,+56]mm · 3 of 46 slices shown (3 of 4)]
[im 1/46]
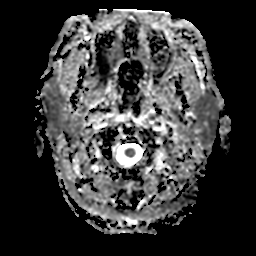
[im 23/46]
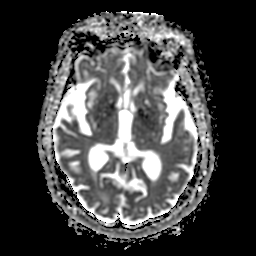
[im 46/46]
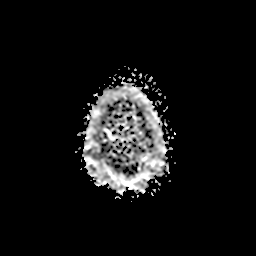

[Series 900: DWI · coronal · 5.0mm · 0.94mm/px · 2 of 39 slices shown (4 of 4)]
[im 1/39]
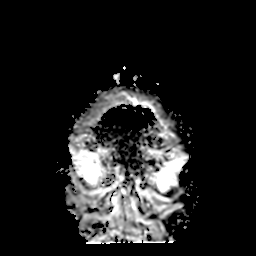
[im 39/39]
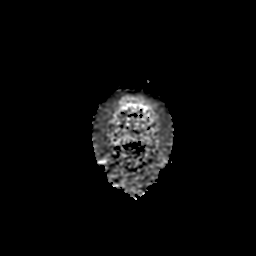

[29 of 48 positions shown; findings below may reference images not displayed]

FINDINGS: MRI HEAD FINDINGS

Multiple sequences are mildly to moderately motion degraded.

There is a small amount of patchy trace diffusion signal abnormality
involving the right basal ganglia and adjacent white matter,
predominantly at the level of the caudate body. This is overall less
than what was present on the prior MRI, however some of this
demonstrates mildly reduced ADC suggestive of a small amount of
recurrent acute ischemia. Restricted diffusion more superiorly in
the right MCA territory on the prior MRI has resolved, with gliosis
and developing encephalomalacia now present in these regions.

Patchy T2 hyperintensities elsewhere in the cerebral white matter
are similar to the prior MRI and compatible with moderate chronic
small vessel ischemic disease. There is mild cerebral atrophy.
Chronic bilateral basal ganglia, thalamic, and pontine lacunar
infarcts are again seen.

Prior bilateral cataract extraction is noted. A 3.2 cm sebaceous
cyst is again seen in the right temporal region/face. No significant
paranasal sinus or mastoid air cell inflammatory disease is seen.
Major intracranial vascular flow voids are grossly preserved, with
the right vertebral artery dominant.

MRA HEAD FINDINGS

Mild motion artifact.

The visualized distal vertebral arteries are patent with the right
being dominant. AICA and SCA origins are patent. Basilar artery is
patent without stenosis. There is a patent left posterior
communicating artery. PCAs are patent without evidence of
significant proximal stenosis.

Internal carotid arteries are patent from skullbase to carotid
termini without evidence of significant stenosis. Moderate tandem
stenoses are again seen involving the mid and distal right M1
segment. There is an early bifurcation of the left MCA without
significant proximal stenosis. No major branch occlusion is seen.
ACAs are patent without evidence of significant stenosis. No
intracranial aneurysm is identified.
IMPRESSION: 1. Small amount of recurrent acute ischemia in the right basal
ganglia.
2. Late subacute to chronic right MCA infarcts, acute on the prior
MRI.
3. Moderate chronic small vessel ischemic disease with chronic
lacunar infarcts as above.
4. No major intracranial arterial occlusion. Moderate right M1 MCA
stenoses, unchanged.

## 2016-06-09 IMAGING — CT CT HEAD W/O CM
1 of 3 series · 11 of 30 positions shown, 14 images · non-contrast
Comparison: None available.

CLINICAL DATA: 59-year-old male code stroke. Sudden onset left side
weakness. Recent fall. Initial encounter.

EXAM:
CT HEAD WITHOUT CONTRAST
TECHNIQUE: Contiguous axial images were obtained from the base of the skull
through the vertex without intravenous contrast.

[Series 5: recon · axial · 0.36mm/px · z∈[+1302,+1432]mm · 11 of 75 slices shown, 14 images]
[im 5/75  brain]
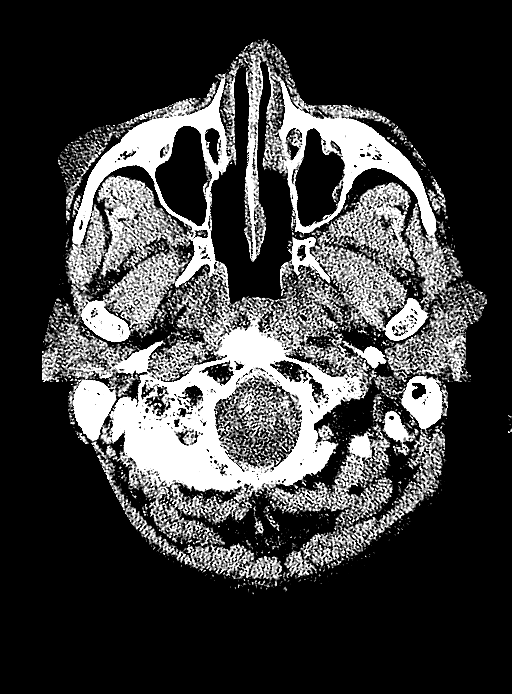
[im 5/75  bone]
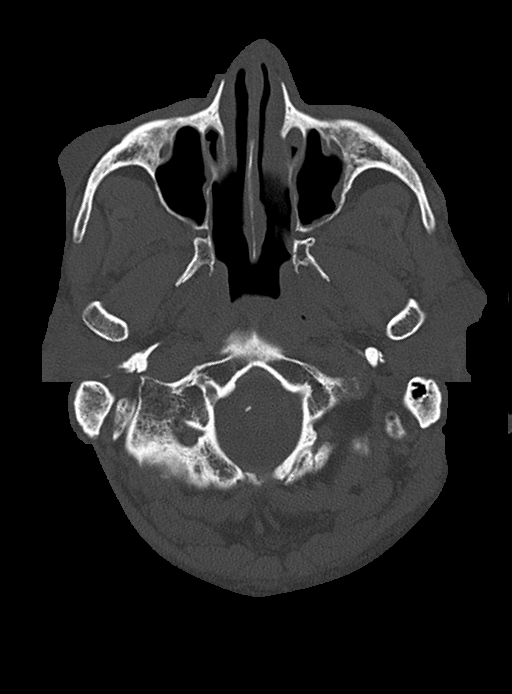
[im 14/75  brain]
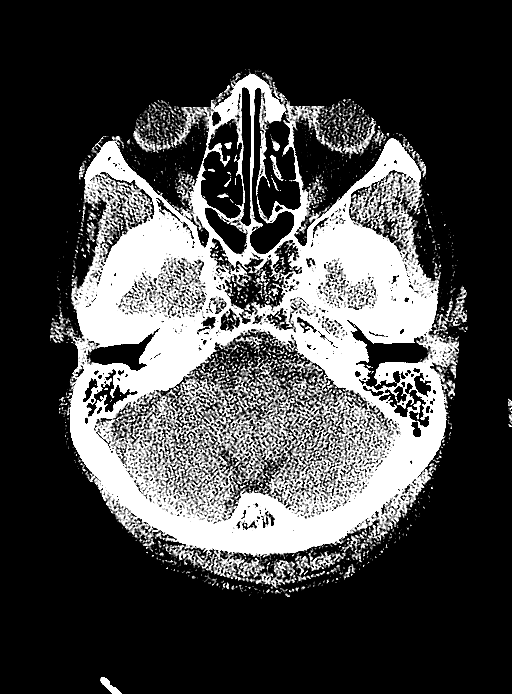
[im 19/75  brain]
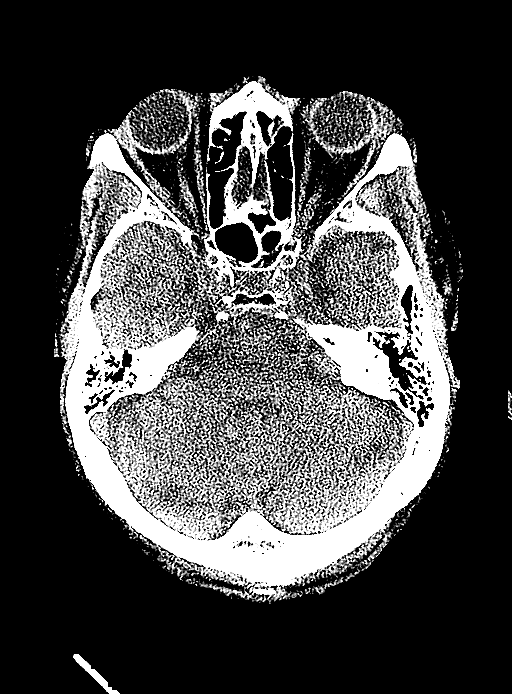
[im 24/75  brain]
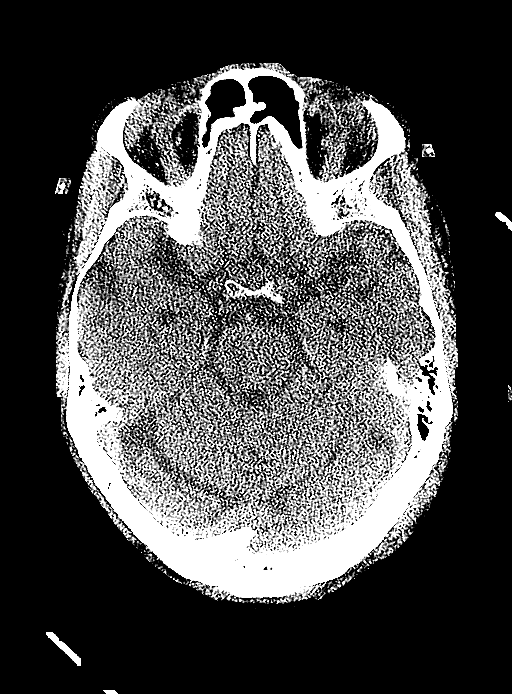
[im 33/75  brain]
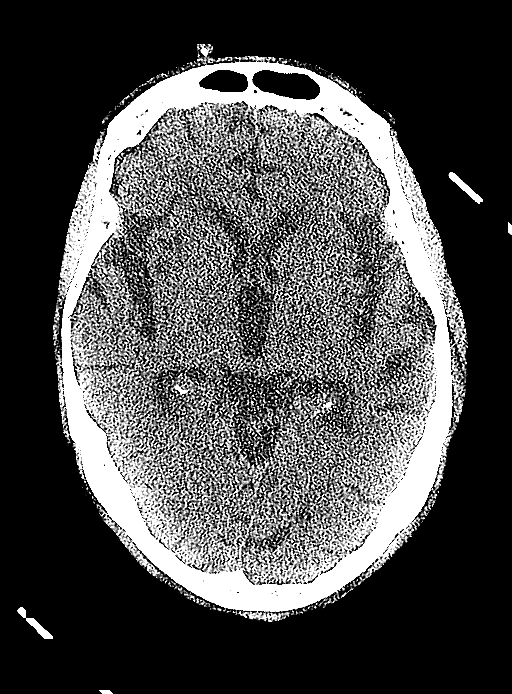
[im 33/75  bone]
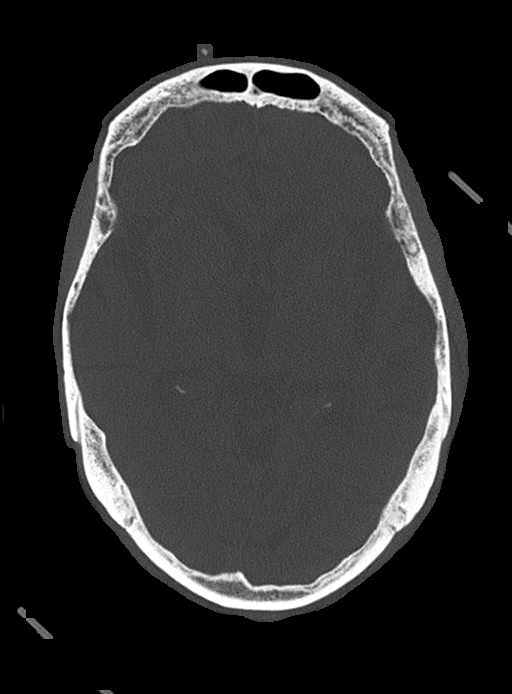
[im 38/75  brain]
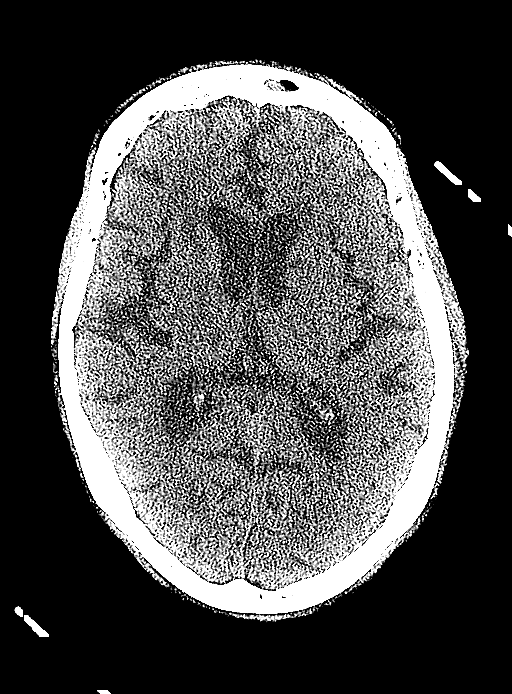
[im 42/75  brain]
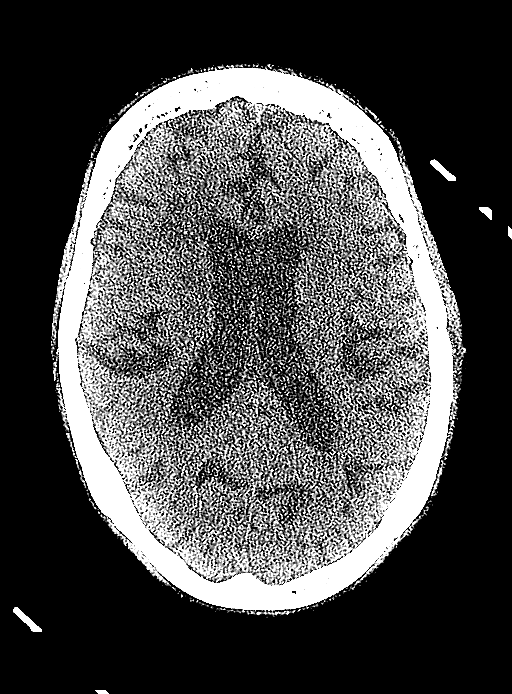
[im 51/75  brain]
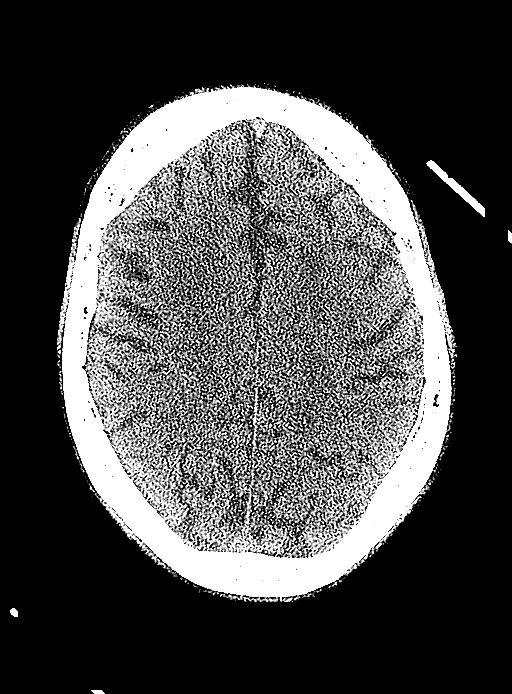
[im 56/75  brain]
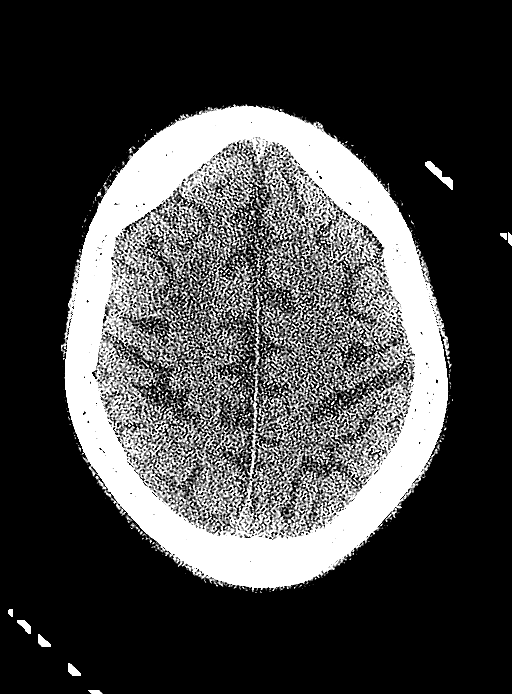
[im 56/75  bone]
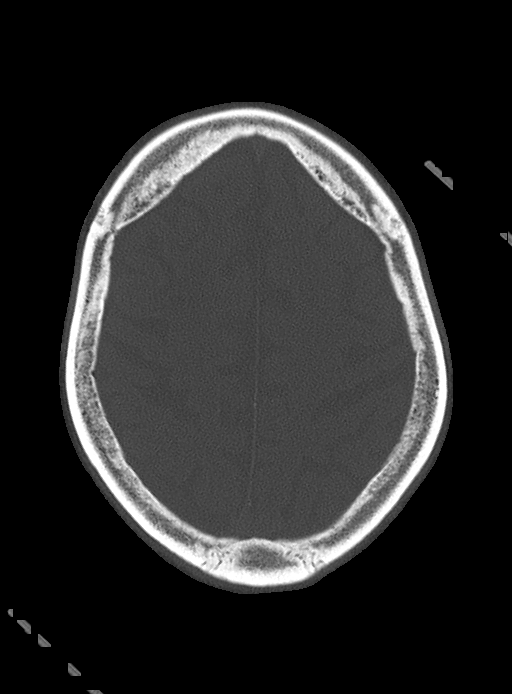
[im 61/75  brain]
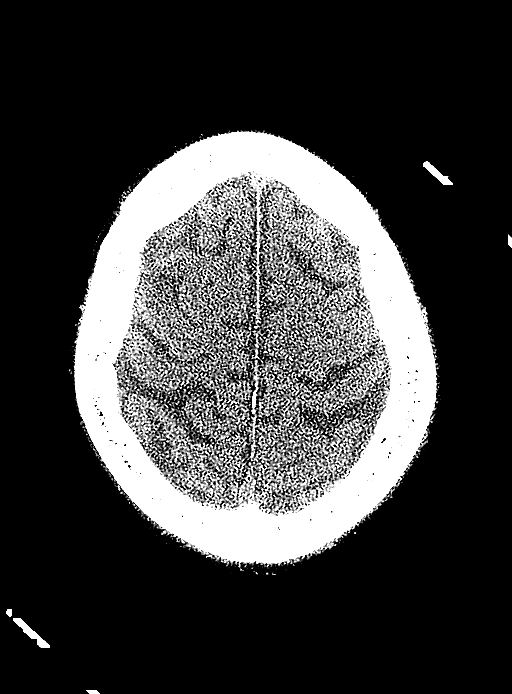
[im 70/75  brain]
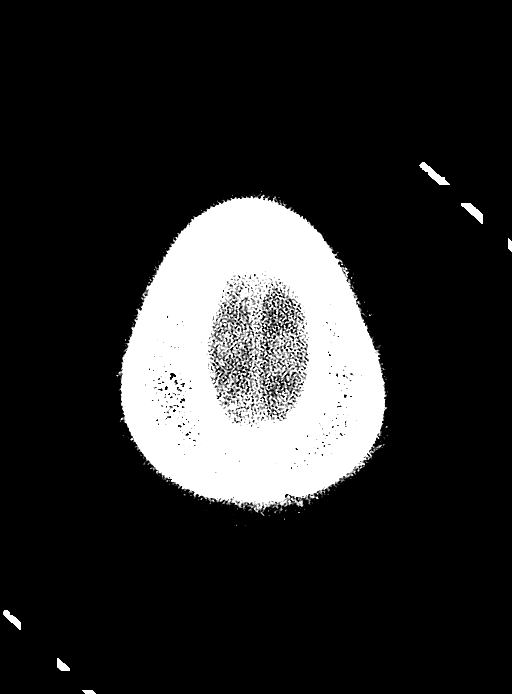

[11 of 30 positions shown; findings below may reference images not displayed]

FINDINGS: Mild paranasal sinus mucosal thickening. Mastoids are clear. No
acute osseous abnormality identified. 3 cm simple fluid density
dermal lesion along the right lateral face suggestive of benign cyst
such as sebaceous cyst. No acute scalp or orbits soft tissue
findings identified.

Calcified atherosclerosis at the skull base. Age indeterminate small
cortically based infarct in the right superior frontal gyrus pre
motor area series 2, image 24. No associated hemorrhage or mass
effect. Evidence of fairly extensive underlying chronic small vessel
disease including age indeterminate lacunar infarcts in the
bilateral deep gray matter nuclei, and patchy bilateral white matter
hypodensity. No suspicious intracranial vascular hyperdensity. No
ventriculomegaly. No midline shift, mass effect, or evidence of
intracranial mass lesion. No acute intracranial hemorrhage
identified.
IMPRESSION: 1. Small age indeterminate cortical infarct in the right superior
frontal gyrus pre motor area. No associated hemorrhage or mass
effect.
2. Advanced underlying chronic small vessel disease.
3. This was discussed in person with Dr. Yanira Sui on
